# Patient Record
Sex: Female | Born: 1990 | Race: Black or African American | Hispanic: No | Marital: Single | Smoking: Never smoker
Health system: Southern US, Community
[De-identification: ages and names within clinical notes are randomized; demographics above are authoritative.]

## PROBLEM LIST (undated history)

## (undated) DIAGNOSIS — E079 Disorder of thyroid, unspecified: Secondary | ICD-10-CM

## (undated) DIAGNOSIS — D649 Anemia, unspecified: Secondary | ICD-10-CM

## (undated) DIAGNOSIS — A599 Trichomoniasis, unspecified: Secondary | ICD-10-CM

## (undated) HISTORY — DX: Disorder of thyroid, unspecified: E07.9

## (undated) HISTORY — DX: Anemia, unspecified: D64.9

## (undated) HISTORY — DX: Trichomoniasis, unspecified: A59.9

---
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
## 2011-02-27 LAB — URINALYSIS, ROUTINE W REFLEX MICROSCOPIC
Bilirubin Urine: NEGATIVE
Glucose, UA: NEGATIVE mg/dL
Ketones, ur: NEGATIVE mg/dL
Protein, ur: NEGATIVE mg/dL
Specific Gravity, Urine: <1.005 — ABNORMAL LOW (ref 1.005–1.030)

## 2011-02-27 LAB — URINE MICROSCOPIC-ADD ON

## 2011-03-22 LAB — URINALYSIS, ROUTINE W REFLEX MICROSCOPIC
Hgb urine dipstick: NEGATIVE
Nitrite: NEGATIVE
Protein, ur: NEGATIVE mg/dL
Urobilinogen, UA: 0.2 mg/dL (ref 0.0–1.0)
pH: 5.5 (ref 5.0–8.0)

## 2011-03-22 LAB — COMPREHENSIVE METABOLIC PANEL
ALT: 14 U/L (ref 0–35)
Alkaline Phosphatase: 88 U/L (ref 39–117)
Calcium: 9.7 mg/dL (ref 8.4–10.5)
Chloride: 106 mEq/L (ref 96–112)
GFR calc Af Amer: >60 mL/min (ref >60)
GFR calc non Af Amer: >60 mL/min (ref >60)
Glucose, Bld: 88 mg/dL (ref 70–99)
Potassium: 4 mEq/L (ref 3.5–5.1)
Total Bilirubin: 0.5 mg/dL (ref 0.3–1.2)

## 2011-03-22 LAB — URINE MICROSCOPIC-ADD ON

## 2011-03-22 LAB — CBC: RDW: 15.1 % (ref 11.5–15.5)

## 2011-03-22 LAB — DIFFERENTIAL
Eosinophils Absolute: 0 10*3/uL (ref 0.0–0.7)
Monocytes Absolute: 0.8 10*3/uL (ref 0.1–1.0)
Neutro Abs: 5.3 10*3/uL (ref 1.7–7.7)
Neutrophils Relative %: 68 % (ref 43–77)

## 2011-03-22 LAB — POCT PREGNANCY, URINE: Preg Test, Ur: NEGATIVE

## 2011-09-16 LAB — POCT PREGNANCY, URINE: Preg Test, Ur: NEGATIVE

## 2011-10-06 ENCOUNTER — Emergency Department (HOSPITAL_COMMUNITY)
Admission: EM | Admit: 2011-10-06 | Discharge: 2011-10-06 | Disposition: A | Discharge status: Home / Self Care | Payer: Self-pay | Attending: Emergency Medicine | Admitting: Emergency Medicine

## 2011-10-06 DIAGNOSIS — L299 Pruritus, unspecified: Secondary | ICD-10-CM | POA: Insufficient documentation

## 2011-10-06 DIAGNOSIS — J45909 Unspecified asthma, uncomplicated: Secondary | ICD-10-CM | POA: Insufficient documentation

## 2011-10-06 DIAGNOSIS — B86 Scabies: Secondary | ICD-10-CM | POA: Insufficient documentation

## 2011-11-05 ENCOUNTER — Encounter: Payer: Self-pay | Admitting: Nurse Practitioner

## 2011-11-05 ENCOUNTER — Emergency Department (HOSPITAL_COMMUNITY)
Admission: EM | Admit: 2011-11-05 | Discharge: 2011-11-05 | Disposition: A | Discharge status: Home / Self Care | Payer: Medicaid Other | Attending: Emergency Medicine | Admitting: Emergency Medicine

## 2011-11-05 DIAGNOSIS — N852 Hypertrophy of uterus: Secondary | ICD-10-CM | POA: Insufficient documentation

## 2011-11-05 DIAGNOSIS — J45909 Unspecified asthma, uncomplicated: Secondary | ICD-10-CM | POA: Insufficient documentation

## 2011-11-05 DIAGNOSIS — O2390 Unspecified genitourinary tract infection in pregnancy, unspecified trimester: Secondary | ICD-10-CM | POA: Insufficient documentation

## 2011-11-05 DIAGNOSIS — R319 Hematuria, unspecified: Secondary | ICD-10-CM | POA: Insufficient documentation

## 2011-11-05 DIAGNOSIS — N390 Urinary tract infection, site not specified: Secondary | ICD-10-CM | POA: Insufficient documentation

## 2011-11-05 DIAGNOSIS — O2690 Pregnancy related conditions, unspecified, unspecified trimester: Secondary | ICD-10-CM

## 2011-11-05 LAB — URINALYSIS, ROUTINE W REFLEX MICROSCOPIC
Bilirubin Urine: NEGATIVE
Glucose, UA: NEGATIVE mg/dL
Protein, ur: 100 mg/dL — AB
pH: 7 (ref 5.0–8.0)

## 2011-11-05 LAB — URINE MICROSCOPIC-ADD ON

## 2011-11-05 LAB — PREGNANCY, URINE: Preg Test, Ur: POSITIVE

## 2011-11-05 MED ORDER — NITROFURANTOIN MONOHYD MACRO 100 MG PO CAPS: 100.0000 mg | ORAL_CAPSULE | Freq: Two times a day (BID) | ORAL | Status: AC

## 2011-11-05 MED ORDER — NITROFURANTOIN MONOHYD MACRO 100 MG PO CAPS
100.0000 mg | ORAL_CAPSULE | ORAL | Status: AC
  Administered 2011-11-05: 100 mg via ORAL
  Filled 2011-11-05: qty 1

## 2011-11-05 NOTE — ED Notes (Signed)
States 11 weeks, 6 days pregnant confirmed at [facility] last month , no OBGYN care yet. Reports today she feels pressure when voiding and noticed a small spot of blood on tissue when she wiped

## 2011-11-05 NOTE — ED Provider Notes (Signed)
History     CSN: [id number] Arrival date & time: 11/05/2011  6:18 PM   First MD Initiated Contact with Patient 11/05/11 1838      Chief Complaint  Patient presents with  . urinary pressure     (Consider location/radiation/quality/duration/timing/severity/associated sxs/prior treatment) HPI Patient states she is 11 weeks 6 days pregnant. She was diagnosed as pregnant last month at [facility] urgent care. Patient has not had an OB/GYN appointment yet. Patient is G1 P0 and today she felt a pressure sensation when voiding. She also noticed a small amount of blood in the urine when she wiped. Patient denies any vaginal bleeding. She denies any abdominal pain. She denies any cramping. Nothing seems to make it better or worse symptoms are mild. Patient states is her first pregnancy she was worse and she came to the hospital to get checked. Past Medical History  Diagnosis Date  . Asthma     History reviewed. No pertinent past surgical history.  History reviewed. No pertinent family history.  History  Substance Use Topics  . Smoking status: Never Smoker   . Smokeless tobacco: Not on file  . Alcohol Use: No    OB History    Grav Para Term Preterm Abortions TAB SAB Ect Mult Living   1               Review of Systems  All other systems reviewed and are negative.    Allergies  Review of patient's allergies indicates no known allergies.  Home Medications   Current Outpatient Rx  Name Route Sig Dispense Refill  . ALBUTEROL SULFATE HFA 108 (90 BASE) MCG/ACT IN AERS Inhalation Inhale 2 puffs into the lungs every 6 (six) hours as needed. For shortness of breath     . PRENATE ELITE 90-600-400 MG-MCG-MCG PO TABS Oral Take 1 tablet by mouth daily.        BP 114/64  Pulse 93  Temp(Src) 98.2 F (36.8 C) (Oral)  Resp 15  Ht 5\' 4"  (1.626 m)  Wt 115 lb (52.164 kg)  BMI 19.74 kg/m2  SpO2 100%  Physical Exam  Nursing note and vitals reviewed. Constitutional: She appears  well-developed and well-nourished. No distress.  HENT:  Head: Normocephalic and atraumatic.  Right Ear: External ear normal.  Left Ear: External ear normal.  Eyes: Conjunctivae are normal. Right eye exhibits no discharge. Left eye exhibits no discharge. No scleral icterus.  Neck: Neck supple. No tracheal deviation present.  Cardiovascular: Normal rate, regular rhythm and intact distal pulses.   Pulmonary/Chest: Effort normal and breath sounds normal. No stridor. No respiratory distress. She has no wheezes. She has no rales.  Abdominal: Soft. Bowel sounds are normal. She exhibits no distension. There is no tenderness. There is no rebound and no guarding.  Genitourinary: Vagina normal. There is no rash, tenderness or lesion on the right labia. There is no rash, tenderness, lesion or injury on the left labia. Uterus is enlarged. Uterus is not tender. Cervix exhibits no motion tenderness and no friability. Right adnexum displays no mass, no tenderness and no fullness. Left adnexum displays no mass, no tenderness and no fullness. No bleeding around the vagina. No vaginal discharge found.  Musculoskeletal: She exhibits no edema and no tenderness.  Neurological: She is alert. She has normal strength. No sensory deficit. Cranial nerve deficit:  no gross defecits noted. She exhibits normal muscle tone. She displays no seizure activity. Coordination normal.  Skin: Skin is warm and dry. No rash noted.  Psychiatric:  She has a normal mood and affect.    ED Course  Procedures (including critical care time)  Labs Reviewed  URINALYSIS, ROUTINE W REFLEX MICROSCOPIC - Abnormal; Notable for the following:    Appearance TURBID (*)    Hgb urine dipstick LARGE (*)    Protein, ur 100 (*)    Leukocytes, UA MODERATE (*)    All other components within normal limits  PREGNANCY, URINE  URINE MICROSCOPIC-ADD ON  GC/CHLAMYDIA PROBE AMP, GENITAL   No results found.   Diagnosis: Urinary tract infection,  pregnancy   MDM  Patient has not had any abdominal pain.  On exam there is no evidence of vaginal bleeding. I suspect her urinary pressure symptoms are associated with urinary tract infection. At this point there does not appear to be any acute complications associated with her pregnancy.        Celene Kras, MD 11/05/11 2031

## 2011-12-27 ENCOUNTER — Encounter (HOSPITAL_COMMUNITY): Payer: Self-pay | Admitting: *Deleted

## 2011-12-27 ENCOUNTER — Emergency Department (HOSPITAL_COMMUNITY)
Admission: EM | Admit: 2011-12-27 | Discharge: 2011-12-27 | Disposition: A | Discharge status: Home / Self Care | Payer: Medicaid Other | Attending: Emergency Medicine | Admitting: Emergency Medicine

## 2011-12-27 DIAGNOSIS — J45909 Unspecified asthma, uncomplicated: Secondary | ICD-10-CM | POA: Insufficient documentation

## 2011-12-27 DIAGNOSIS — R51 Headache: Secondary | ICD-10-CM | POA: Insufficient documentation

## 2011-12-27 DIAGNOSIS — R05 Cough: Secondary | ICD-10-CM | POA: Insufficient documentation

## 2011-12-27 DIAGNOSIS — J029 Acute pharyngitis, unspecified: Secondary | ICD-10-CM | POA: Insufficient documentation

## 2011-12-27 DIAGNOSIS — R059 Cough, unspecified: Secondary | ICD-10-CM | POA: Insufficient documentation

## 2011-12-27 DIAGNOSIS — O9989 Other specified diseases and conditions complicating pregnancy, childbirth and the puerperium: Secondary | ICD-10-CM | POA: Insufficient documentation

## 2011-12-27 DIAGNOSIS — J069 Acute upper respiratory infection, unspecified: Secondary | ICD-10-CM | POA: Insufficient documentation

## 2011-12-27 LAB — RAPID STREP SCREEN (MED CTR MEBANE ONLY): Streptococcus, Group A Screen (Direct): NEGATIVE

## 2011-12-27 NOTE — ED Provider Notes (Signed)
History     CSN: [id number]  Arrival date & time 12/27/11  0745   First MD Initiated Contact with Patient 12/27/11 [phone]      Chief Complaint  Patient presents with  . Cough  . Sore Throat    (Consider location/radiation/quality/duration/timing/severity/associated sxs/prior treatment) Patient is a 21 y.o. female presenting with cough and pharyngitis. The history is provided by the patient.  Cough The current episode started yesterday. The problem occurs constantly. The problem has been gradually worsening. The cough is non-productive. There has been no fever. Associated symptoms include headaches and sore throat. Pertinent negatives include no myalgias and no shortness of breath. She has tried nothing for the symptoms.  Sore Throat Associated symptoms include headaches. Pertinent negatives include no shortness of breath.   patient is worried that she has sore throat. It increases when she coughs to swallow or drink anything. She is pregnant so she did not want to take anything over-the-counter. She denies any vaginal bleeding or discharge. She denies any abdominal pain or any other complaints associated with her pregnancy.  She is [redacted] weeks pregnant.    Past Medical History  Diagnosis Date  . Asthma     History reviewed. No pertinent past surgical history.  No family history on file.  History  Substance Use Topics  . Smoking status: Never Smoker   . Smokeless tobacco: Not on file  . Alcohol Use: No    OB History    Grav Para Term Preterm Abortions TAB SAB Ect Mult Living   1               Review of Systems  HENT: Positive for sore throat.   Respiratory: Positive for cough. Negative for shortness of breath.   Musculoskeletal: Negative for myalgias.  Neurological: Positive for headaches.  All other systems reviewed and are negative.    Allergies  Review of patient's allergies indicates no known allergies.  Home Medications   Current Outpatient Rx  Name Route  Sig Dispense Refill  . PRENATE ELITE 90-600-400 MG-MCG-MCG PO TABS Oral Take 1 tablet by mouth daily.      . ALBUTEROL SULFATE HFA 108 (90 BASE) MCG/ACT IN AERS Inhalation Inhale 2 puffs into the lungs every 6 (six) hours as needed. For shortness of breath       BP 102/53  Pulse 86  Temp(Src) 98.1 F (36.7 C) (Oral)  Resp 16  SpO2 100%  Physical Exam  Nursing note and vitals reviewed. Constitutional: She appears well-developed and well-nourished. No distress.  HENT:  Head: Normocephalic and atraumatic. No trismus in the jaw.  Right Ear: Tympanic membrane and external ear normal.  Left Ear: Tympanic membrane and external ear normal.  Mouth/Throat: Oropharynx is clear and moist. No oral lesions. Normal dentition. No uvula swelling. No oropharyngeal exudate, posterior oropharyngeal edema, posterior oropharyngeal erythema or tonsillar abscesses.  Eyes: Conjunctivae are normal. Right eye exhibits no discharge. Left eye exhibits no discharge. No scleral icterus.  Neck: Neck supple. No tracheal deviation present.  Cardiovascular: Normal rate and regular rhythm.   Pulmonary/Chest: Effort normal and breath sounds normal. No stridor. No respiratory distress.  Abdominal: Soft. There is no tenderness.  Musculoskeletal: She exhibits no edema.  Neurological: She is alert. Cranial nerve deficit: no gross deficits.  Skin: Skin is warm and dry. No rash noted.  Psychiatric: She has a normal mood and affect.    ED Course  Procedures (including critical care time)   Labs Reviewed  RAPID STREP SCREEN  No results found.   1. Pharyngitis   2. URI (upper respiratory infection)       MDM  Likely viral pharyngitis associated with her cold symptoms.  Discussed supportive care, tylenol as needed for pain.       Celene Kras, MD 12/27/11 [phone]

## 2011-12-27 NOTE — ED Notes (Signed)
Pt sts she had a sore throat last night that got worse this am, developed dry cough, chest and nasal congestion, HA. Denies fever at home. Sts 4 months pregnant and did not want to take any meds because she wasn't sure she could.

## 2012-01-05 ENCOUNTER — Other Ambulatory Visit (HOSPITAL_COMMUNITY): Payer: Self-pay | Admitting: Obstetrics

## 2012-01-05 DIAGNOSIS — Z3689 Encounter for other specified antenatal screening: Secondary | ICD-10-CM

## 2012-01-10 ENCOUNTER — Ambulatory Visit (HOSPITAL_COMMUNITY)
Admission: RE | Admit: 2012-01-10 | Discharge: 2012-01-10 | Disposition: A | Discharge status: Home / Self Care | Payer: Medicaid Other | Source: Ambulatory Visit | Attending: Obstetrics | Admitting: Obstetrics

## 2012-01-10 DIAGNOSIS — Z3689 Encounter for other specified antenatal screening: Secondary | ICD-10-CM

## 2012-01-10 DIAGNOSIS — Z1389 Encounter for screening for other disorder: Secondary | ICD-10-CM | POA: Insufficient documentation

## 2012-01-10 DIAGNOSIS — Z363 Encounter for antenatal screening for malformations: Secondary | ICD-10-CM | POA: Insufficient documentation

## 2012-01-10 DIAGNOSIS — O358XX0 Maternal care for other (suspected) fetal abnormality and damage, not applicable or unspecified: Secondary | ICD-10-CM | POA: Insufficient documentation

## 2013-09-24 ENCOUNTER — Encounter (HOSPITAL_COMMUNITY): Payer: Self-pay | Admitting: Emergency Medicine

## 2013-09-24 ENCOUNTER — Emergency Department (HOSPITAL_COMMUNITY): Payer: Medicaid Other

## 2013-09-24 ENCOUNTER — Emergency Department (HOSPITAL_COMMUNITY)
Admission: EM | Admit: 2013-09-24 | Discharge: 2013-09-24 | Disposition: A | Discharge status: Home / Self Care | Payer: Medicaid Other | Attending: Emergency Medicine | Admitting: Emergency Medicine

## 2013-09-24 DIAGNOSIS — M67919 Unspecified disorder of synovium and tendon, unspecified shoulder: Secondary | ICD-10-CM | POA: Insufficient documentation

## 2013-09-24 DIAGNOSIS — M76899 Other specified enthesopathies of unspecified lower limb, excluding foot: Secondary | ICD-10-CM

## 2013-09-24 DIAGNOSIS — Z79899 Other long term (current) drug therapy: Secondary | ICD-10-CM | POA: Insufficient documentation

## 2013-09-24 DIAGNOSIS — M719 Bursopathy, unspecified: Secondary | ICD-10-CM | POA: Insufficient documentation

## 2013-09-24 DIAGNOSIS — J45909 Unspecified asthma, uncomplicated: Secondary | ICD-10-CM | POA: Insufficient documentation

## 2013-09-24 MED ORDER — HYDROCODONE-ACETAMINOPHEN 5-325 MG PO TABS: 1.0000 | ORAL_TABLET | Freq: Four times a day (QID) | ORAL | Status: DC | PRN

## 2013-09-24 MED ORDER — IBUPROFEN 800 MG PO TABS: 800.0000 mg | ORAL_TABLET | Freq: Three times a day (TID) | ORAL | Status: DC | PRN

## 2013-09-24 NOTE — ED Provider Notes (Signed)
CSN: [id number]     Arrival date & time 09/24/13  1230 History  This chart was scribed for non-physician practitioner Ebbie Ridge, PA-C, working with Laray Anger, DO by Ronal Fear, ED scribe. This patient was seen in room TR09C/TR09C and the patient's care was started at 1:55 PM.    Chief Complaint  Patient presents with  . Shoulder Pain    The history is provided by the patient. No language interpreter was used.   HPI Comments: Kathleen Holland is a 22 y.o. female who presents to the Emergency Department complaining of gradual onset left shoulder pain onset 4x days ago. She denies fall, or injury to the area. Pt denies fever or cough.   Past Medical History  Diagnosis Date  . Asthma    History reviewed. No pertinent past surgical history. No family history on file. History  Substance Use Topics  . Smoking status: Never Smoker   . Smokeless tobacco: Not on file  . Alcohol Use: No   OB History   Grav Para Term Preterm Abortions TAB SAB Ect Mult Living   1              Review of Systems  Constitutional: Negative for fever.  Respiratory: Negative for cough.   Musculoskeletal: Positive for myalgias.  All other systems reviewed and are negative.    Allergies  Review of patient's allergies indicates no known allergies.  Home Medications   Current Outpatient Rx  Name  Route  Sig  Dispense  Refill  . albuterol (PROVENTIL HFA;VENTOLIN HFA) 108 (90 BASE) MCG/ACT inhaler   Inhalation   Inhale 2 puffs into the lungs every 6 (six) hours as needed. For shortness of breath           BP 129/56  Pulse 71  Temp(Src) 98.5 F (36.9 C) (Oral)  Resp 16  Wt 128 lb 12.8 oz (58.423 kg)  BMI 22.1 kg/m2  SpO2 100%  LMP 09/14/2013  Breastfeeding? Unknown Physical Exam  Nursing note and vitals reviewed. Constitutional: She is oriented to person, place, and time. She appears well-developed and well-nourished. No distress.  HENT:  Head: Normocephalic and atraumatic.   Eyes: EOM are normal.  Neck: Neck supple. No tracheal deviation present.  Cardiovascular: Normal rate.   Pulmonary/Chest: Effort normal. No respiratory distress.  Musculoskeletal: Normal range of motion. She exhibits tenderness.  Neurological: She is alert and oriented to person, place, and time.  Skin: Skin is warm and dry.  Psychiatric: She has a normal mood and affect. Her behavior is normal.    ED Course  Procedures (including critical care time) DIAGNOSTIC STUDIES: Oxygen Saturation is 100% on RA, normal by my interpretation.    COORDINATION OF CARE:  1:57 PM- Pt advised of plan for treatment including an X-ray and pt agrees.  Return here as needed.  Advised the patient is most likely tendinitis and shoulder strain.  She is also given orthopedic followup.  Told to use ice and heat on her shoulder    I personally performed the services described in this documentation, which was scribed in my presence. The recorded information has been reviewed and is accurate.   Carlyle Dolly, PA-C 09/24/13 1457

## 2013-09-24 NOTE — ED Notes (Signed)
Reports left shoulder pain x 4 days. Denies injury, pain with movement. Sensation intact. Pt is a x 4.

## 2013-09-25 NOTE — ED Provider Notes (Signed)
Medical screening examination/treatment/procedure(s) were performed by non-physician practitioner and as supervising physician I was immediately available for consultation/collaboration.   Laray Anger, DO 09/25/13 2128

## 2014-10-13 ENCOUNTER — Encounter (HOSPITAL_COMMUNITY): Payer: Self-pay | Admitting: Emergency Medicine

## 2014-12-10 ENCOUNTER — Encounter (HOSPITAL_COMMUNITY): Payer: Self-pay | Admitting: Emergency Medicine

## 2014-12-10 ENCOUNTER — Emergency Department (HOSPITAL_COMMUNITY): Payer: No Typology Code available for payment source

## 2014-12-10 ENCOUNTER — Emergency Department (HOSPITAL_COMMUNITY)
Admission: EM | Admit: 2014-12-10 | Discharge: 2014-12-10 | Disposition: A | Discharge status: Home / Self Care | Payer: No Typology Code available for payment source | Attending: Emergency Medicine | Admitting: Emergency Medicine

## 2014-12-10 DIAGNOSIS — Y9389 Activity, other specified: Secondary | ICD-10-CM | POA: Insufficient documentation

## 2014-12-10 DIAGNOSIS — S3992XA Unspecified injury of lower back, initial encounter: Secondary | ICD-10-CM | POA: Insufficient documentation

## 2014-12-10 DIAGNOSIS — Z79899 Other long term (current) drug therapy: Secondary | ICD-10-CM | POA: Diagnosis not present

## 2014-12-10 DIAGNOSIS — S199XXA Unspecified injury of neck, initial encounter: Secondary | ICD-10-CM | POA: Insufficient documentation

## 2014-12-10 DIAGNOSIS — S39012A Strain of muscle, fascia and tendon of lower back, initial encounter: Secondary | ICD-10-CM | POA: Diagnosis not present

## 2014-12-10 DIAGNOSIS — Y92410 Unspecified street and highway as the place of occurrence of the external cause: Secondary | ICD-10-CM | POA: Diagnosis not present

## 2014-12-10 DIAGNOSIS — M545 Low back pain, unspecified: Secondary | ICD-10-CM

## 2014-12-10 DIAGNOSIS — J45909 Unspecified asthma, uncomplicated: Secondary | ICD-10-CM | POA: Insufficient documentation

## 2014-12-10 DIAGNOSIS — Y998 Other external cause status: Secondary | ICD-10-CM | POA: Diagnosis not present

## 2014-12-10 MED ORDER — NAPROXEN 500 MG PO TABS: 500.0000 mg | ORAL_TABLET | Freq: Two times a day (BID) | ORAL | Status: DC

## 2014-12-10 MED ORDER — METHOCARBAMOL 500 MG PO TABS: 500.0000 mg | ORAL_TABLET | Freq: Two times a day (BID) | ORAL | Status: DC

## 2014-12-10 MED ORDER — IBUPROFEN 400 MG PO TABS
800.0000 mg | ORAL_TABLET | Freq: Once | ORAL | Status: AC
  Administered 2014-12-10: 800 mg via ORAL
  Filled 2014-12-10: qty 2
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
## 2014-12-10 NOTE — Discharge Instructions (Signed)
1. Medications: robaxin, naproxyn, usual home medications °2. Treatment: rest, drink plenty of fluids, gentle stretching as discussed, alternate ice and heat °3. Follow Up: Please followup with your primary doctor in 3 days for discussion of your diagnoses and further evaluation after today's visit; if you do not have a primary care doctor use the resource guide provided to find one;  Return to the ER for worsening back pain, difficulty walking, loss of bowel or bladder control or other concerning symptoms ° ° ° °Back Exercises °Back exercises help treat and prevent back injuries. The goal of back exercises is to increase the strength of your abdominal and back muscles and the flexibility of your back. These exercises should be started when you no longer have back pain. Back exercises include: °· Pelvic Tilt. Lie on your back with your knees bent. Tilt your pelvis until the lower part of your back is against the floor. Hold this position 5 to 10 sec and repeat 5 to 10 times. °· Knee to Chest. Pull first 1 knee up against your chest and hold for 20 to 30 seconds, repeat this with the other knee, and then both knees. This may be done with the other leg straight or bent, whichever feels better. °· Sit-Ups or Curl-Ups. Bend your knees 90 degrees. Start with tilting your pelvis, and do a partial, slow sit-up, lifting your trunk only 30 to 45 degrees off the floor. Take at least 2 to 3 seconds for each sit-up. Do not do sit-ups with your knees out straight. If partial sit-ups are difficult, simply do the above but with only tightening your abdominal muscles and holding it as directed. °· Hip-Lift. Lie on your back with your knees flexed 90 degrees. Push down with your feet and shoulders as you raise your hips a couple inches off the floor; hold for 10 seconds, repeat 5 to 10 times. °· Back arches. Lie on your stomach, propping yourself up on bent elbows. Slowly press on your hands, causing an arch in your low back. Repeat  3 to 5 times. Any initial stiffness and discomfort should lessen with repetition over time. °· Shoulder-Lifts. Lie face down with arms beside your body. Keep hips and torso pressed to floor as you slowly lift your head and shoulders off the floor. °Do not overdo your exercises, especially in the beginning. Exercises may cause you some mild back discomfort which lasts for a few minutes; however, if the pain is more severe, or lasts for more than 15 minutes, do not continue exercises until you see your caregiver. Improvement with exercise therapy for back problems is slow.  °See your caregivers for assistance with developing a proper back exercise program. °Document Released: 01/05/2005 Document Revised: 02/20/2012 Document Reviewed: 09/29/2011 °[employer]® Patient Information ©2015 [employer]. This information is not intended to replace advice given to you by your health care provider. Make sure you discuss any questions you have with your health care provider. ° °
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
## 2015-05-20 LAB — CYTOLOGY - PAP
CHLAMYDIA TRACHOMATIS RNA: NEGATIVE
N GONORRHOEA RRNA NPH QL PCR: NEGATIVE
PAP SMEAR: NEGATIVE

## 2016-03-11 ENCOUNTER — Emergency Department (HOSPITAL_COMMUNITY)
Admission: EM | Admit: 2016-03-11 | Discharge: 2016-03-11 | Disposition: A | Discharge status: Home / Self Care | Payer: No Typology Code available for payment source | Attending: Emergency Medicine | Admitting: Emergency Medicine

## 2016-03-11 ENCOUNTER — Encounter (HOSPITAL_COMMUNITY): Payer: Self-pay | Admitting: Family Medicine

## 2016-03-11 DIAGNOSIS — Z791 Long term (current) use of non-steroidal anti-inflammatories (NSAID): Secondary | ICD-10-CM | POA: Diagnosis not present

## 2016-03-11 DIAGNOSIS — Z3202 Encounter for pregnancy test, result negative: Secondary | ICD-10-CM | POA: Diagnosis not present

## 2016-03-11 DIAGNOSIS — Z79899 Other long term (current) drug therapy: Secondary | ICD-10-CM | POA: Insufficient documentation

## 2016-03-11 DIAGNOSIS — J45909 Unspecified asthma, uncomplicated: Secondary | ICD-10-CM | POA: Insufficient documentation

## 2016-03-11 DIAGNOSIS — Y9389 Activity, other specified: Secondary | ICD-10-CM | POA: Insufficient documentation

## 2016-03-11 DIAGNOSIS — S3991XA Unspecified injury of abdomen, initial encounter: Secondary | ICD-10-CM | POA: Insufficient documentation

## 2016-03-11 DIAGNOSIS — Y92410 Unspecified street and highway as the place of occurrence of the external cause: Secondary | ICD-10-CM | POA: Insufficient documentation

## 2016-03-11 DIAGNOSIS — Y998 Other external cause status: Secondary | ICD-10-CM | POA: Diagnosis not present

## 2016-03-11 LAB — URINALYSIS, ROUTINE W REFLEX MICROSCOPIC
Bilirubin Urine: NEGATIVE
GLUCOSE, UA: NEGATIVE mg/dL
HGB URINE DIPSTICK: NEGATIVE
KETONES UR: NEGATIVE mg/dL
Nitrite: NEGATIVE
PROTEIN: NEGATIVE mg/dL
Specific Gravity, Urine: 1.034 — ABNORMAL HIGH (ref 1.005–1.030)
pH: 5.5 (ref 5.0–8.0)

## 2016-03-11 LAB — URINE MICROSCOPIC-ADD ON

## 2016-03-11 LAB — POC URINE PREG, ED: PREG TEST UR: NEGATIVE

## 2016-03-11 MED ORDER — IBUPROFEN 800 MG PO TABS: 800.0000 mg | ORAL_TABLET | Freq: Three times a day (TID) | ORAL | Status: DC | PRN

## 2016-03-11 MED ORDER — METHOCARBAMOL 500 MG PO TABS: 500.0000 mg | ORAL_TABLET | Freq: Two times a day (BID) | ORAL | Status: DC

## 2016-03-11 NOTE — ED Notes (Signed)
Unable to assess patient before discharge. See PA note for secondary assesment
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
## 2016-03-11 NOTE — Discharge Instructions (Signed)

## 2016-08-20 ENCOUNTER — Encounter: Payer: Self-pay | Admitting: *Deleted

## 2017-06-28 ENCOUNTER — Ambulatory Visit: Payer: Medicaid Other | Attending: Family Medicine | Admitting: Family Medicine

## 2017-06-28 ENCOUNTER — Other Ambulatory Visit (HOSPITAL_COMMUNITY)
Admission: RE | Admit: 2017-06-28 | Discharge: 2017-06-28 | Disposition: A | Discharge status: Home / Self Care | Payer: Medicaid Other | Source: Ambulatory Visit | Attending: Family Medicine | Admitting: Family Medicine

## 2017-06-28 ENCOUNTER — Encounter: Payer: Self-pay | Admitting: Family Medicine

## 2017-06-28 VITALS — BP 103/67 | HR 77 | Temp 98.3°F | Resp 18 | Ht 64.0 in | Wt 137.0 lb

## 2017-06-28 DIAGNOSIS — Z79899 Other long term (current) drug therapy: Secondary | ICD-10-CM | POA: Diagnosis not present

## 2017-06-28 DIAGNOSIS — Z0001 Encounter for general adult medical examination with abnormal findings: Secondary | ICD-10-CM | POA: Insufficient documentation

## 2017-06-28 DIAGNOSIS — A5901 Trichomonal vulvovaginitis: Secondary | ICD-10-CM | POA: Diagnosis not present

## 2017-06-28 DIAGNOSIS — Z8249 Family history of ischemic heart disease and other diseases of the circulatory system: Secondary | ICD-10-CM | POA: Diagnosis not present

## 2017-06-28 DIAGNOSIS — Z833 Family history of diabetes mellitus: Secondary | ICD-10-CM | POA: Diagnosis not present

## 2017-06-28 DIAGNOSIS — Z118 Encounter for screening for other infectious and parasitic diseases: Secondary | ICD-10-CM | POA: Insufficient documentation

## 2017-06-28 DIAGNOSIS — B9689 Other specified bacterial agents as the cause of diseases classified elsewhere: Secondary | ICD-10-CM | POA: Diagnosis not present

## 2017-06-28 DIAGNOSIS — Z809 Family history of malignant neoplasm, unspecified: Secondary | ICD-10-CM | POA: Diagnosis not present

## 2017-06-28 DIAGNOSIS — J45909 Unspecified asthma, uncomplicated: Secondary | ICD-10-CM | POA: Diagnosis not present

## 2017-06-28 DIAGNOSIS — Z23 Encounter for immunization: Secondary | ICD-10-CM | POA: Diagnosis not present

## 2017-06-28 DIAGNOSIS — N979 Female infertility, unspecified: Secondary | ICD-10-CM | POA: Diagnosis not present

## 2017-06-28 DIAGNOSIS — Z0000 Encounter for general adult medical examination without abnormal findings: Secondary | ICD-10-CM | POA: Insufficient documentation

## 2017-06-28 MED ORDER — ALBUTEROL SULFATE HFA 108 (90 BASE) MCG/ACT IN AERS: 2.0000 | INHALATION_SPRAY | Freq: Four times a day (QID) | RESPIRATORY_TRACT | 3 refills | Status: DC | PRN

## 2017-06-28 NOTE — Patient Instructions (Addendum)
Apply for orange card  Asthma, Adult Asthma is a condition of the lungs in which the airways tighten and narrow. Asthma can make it hard to breathe. Asthma cannot be cured, but medicine and lifestyle changes can help control it. Asthma may be started (triggered) by:  Animal skin flakes (dander).  Dust.  Cockroaches.  Pollen.  Mold.  Smoke.  Cleaning products.  Hair sprays or aerosol sprays.  Paint fumes or strong smells.  Cold air, weather changes, and winds.  Crying or laughing hard.  Stress.  Certain medicines or drugs.  Foods, such as dried fruit, potato chips, and sparkling grape juice.  Infections or conditions (colds, flu).  Exercise.  Certain medical conditions or diseases.  Exercise or tiring activities.  Follow these instructions at home:  Take medicine as told by your doctor.  Use a peak flow meter as told by your doctor. A peak flow meter is a tool that measures how well the lungs are working.  Record and keep track of the peak flow meter's readings.  Understand and use the asthma action plan. An asthma action plan is a written plan for taking care of your asthma and treating your attacks.  To help prevent asthma attacks: ? Do not smoke. Stay away from secondhand smoke. ? Change your heating and air conditioning filter often. ? Limit your use of fireplaces and wood stoves. ? Get rid of pests (such as roaches and mice) and their droppings. ? Throw away plants if you see mold on them. ? Clean your floors. Dust regularly. Use cleaning products that do not smell. ? Have someone vacuum when you are not home. Use a vacuum cleaner with a HEPA filter if possible. ? Replace carpet with wood, tile, or vinyl flooring. Carpet can trap animal skin flakes and dust. ? Use allergy-proof pillows, mattress covers, and box spring covers. ? Wash bed sheets and blankets every week in hot water and dry them in a dryer. ? Use blankets that are made of polyester or  cotton. ? Clean bathrooms and kitchens with bleach. If possible, have someone repaint the walls in these rooms with mold-resistant paint. Keep out of the rooms that are being cleaned and painted. ? Wash hands often. Contact a doctor if:  You have make a whistling sound when breaking (wheeze), have shortness of breath, or have a cough even if taking medicine to prevent attacks.  The colored mucus you cough up (sputum) is thicker than usual.  The colored mucus you cough up changes from clear or white to yellow, green, gray, or bloody.  You have problems from the medicine you are taking such as: ? A rash. ? Itching. ? Swelling. ? Trouble breathing.  You need reliever medicines more than 2-3 times a week.  Your peak flow measurement is still at 50-79% of your personal best after following the action plan for 1 hour.  You have a fever. Get help right away if:  You seem to be worse and are not responding to medicine during an asthma attack.  You are short of breath even at rest.  You get short of breath when doing very little activity.  You have trouble eating, drinking, or talking.  You have chest pain.  You have a fast heartbeat.  Your lips or fingernails start to turn blue.  You are light-headed, dizzy, or faint.  Your peak flow is less than 50% of your personal best. This information is not intended to replace advice given to you by  your health care provider. Make sure you discuss any questions you have with your health care provider. Document Released: 05/16/2008 Document Revised: 05/05/2016 Document Reviewed: 06/27/2013 Elsevier Interactive Patient Education  2017 [employer]   Eating Healthy on a Budget There are many ways to save money at the grocery store and continue to eat healthy. You can be successful if you plan your meals according to your budget, purchase according to your budget and grocery list, and prepare food yourself. How can I buy more food on a  limited budget? Plan  Plan meals and snacks according to a grocery list and budget you create.  Look for recipes where you can cook once and make enough food for two meals.  Include meals that will "stretch" more expensive foods such as stews, casseroles, and stir-fry dishes.  Make a grocery list and make sure to bring it with you to the store. If you have a smart phone, you could use your phone to create your shopping list. Purchase  When grocery shopping, buy only the items on your grocery list and go only to the areas of the store that have the items on your list. Prepare  Some meal items can be prepared in advance. Pre-cook on days when you have extra time.  Make extra food (such as by doubling recipes) and freeze the extras in meal-sized containers or in individual portions for fast meals and snacks.  Use leftovers in your meal plan for the week.  Try some meatless meals or try "no cook" meals like salads.  When you come home from the grocery store, wash and prepare your fruits and vegetables so they are ready to use and eat. This will help reduce food waste. How can I buy more food on a limited budget? Try these tips the next time you go shopping:  [place] store brands or generic brands.  Use coupons only for foods and brands you normally buy. Avoid buying items you wouldn't normally buy simply because they are on sale.  Check online and in newspapers for weekly deals.  Buy healthy items from the bulk bins when available, such as herbs, spices, flours, pastas, nuts, and dried fruit.  Buy fruits and vegetables that are in season. Prices are usually lower on in-season produce.  Compare and contrast different items. You can do this by looking at the unit price on the price tag. Use it to compare different brands and sizes to find out which item is the best deal.  Choose naturally low-cost healthy items, such as carrots, potatoes, apples, bananas, and oranges. Dried or canned  beans are a low-cost protein source.  Buy in bulk and freeze extra food. Items you can buy in bulk include meats, fish, poultry, frozen fruits, and frozen vegetables.  Limit the purchase of prepared or "ready-to-eat" foods, such as pre-cut fruits and vegetables and pre-made salads.  If possible, shop around to discover which grocery store offers the best prices. Some stores charge much more than other stores for the same items.  Do not shop when you are hungry. If you shop while hungry, It may be hard to stick to your list and budget.  Stick to your list and resist impulse buys. Treat your list as your official plan for the week.  Buy a variety of vegetables and fruit by purchasing fresh, frozen, and canned items.  Look beyond eye level. Foods at eye level (adult or child eye level) are more expensive. Look at the top and bottom  shelves for deals.  Be efficient with your time when shopping. The more time you spend at the store, the more money you are likely to spend.  Consider other retailers such as dollar stores, larger [employer], local fruit and vegetable stands, and farmers markets.  What are some tips for less expensive food substitutions? When choosing more expensive foods like meats and dairy, try these tips to save money:  Choose cheaper cuts of meat, such as bone-in chicken thighs and drumsticks instead skinless and boneless chicken. When you are ready to prepare the chicken, you can remove the skin yourself to make it healthier.  Choose lean meats like chicken or [place]. When choosing ground beef, make sure it is lean ground beef (92% lean, 8% fat). If you do buy a fattier ground beef, drain the fat before eating.  Buy dried beans and peas, such as lentils, split peas, or kidney beans.  For seafood, choose canned tuna, salmon, or sardines.  Eggs are a low-cost source of protein.  Buy the larger tubs of yogurt instead of individual-sized containers.  Choose water  instead of sodas and other sweetened beverages.  Skip buying chips, cookies, and other "junk food". These items are usually expensive, high in calories, and low in nutritional value.  How can I prepare the foods I buy in the healthiest way? Practice these tips for cooking foods in the healthiest way to reduce excess fat and calorie intake:  Steam, saute, grill, or bake foods instead of frying them.  Make sure half your plate is filled with fruits or vegetables. Choose from fresh, frozen, or canned fruits and vegetables. If eating canned, remember to rinse them before eating. This will remove any excess salt added for packaging.  Trim all fat from meat before cooking. Remove the skin from chicken or [place].  Spoon off fat from meat dishes once they have been chilled in the refrigerator and the fat has hardened on the top.  Use skim milk, low-fat milk, or evaporated skim milk when making cream sauces, soups, or puddings.  Substitute low-fat yogurt, sour cream, or cottage cheese for sour cream and mayonnaise in dips and dressings.  Try lemon juice, herbs, or spices to season food instead of salt, butter, or margarine.  This information is not intended to replace advice given to you by your health care provider. Make sure you discuss any questions you have with your health care provider. Document Released: 08/01/2014 Document Revised: 06/17/2016 Document Reviewed: 07/01/2014 Elsevier Interactive Patient Education  [employer].

## 2017-06-28 NOTE — Progress Notes (Signed)
Patient is here for establish care   Patient is not on any current med  Patient ha snot eaten for today

## 2017-06-28 NOTE — Progress Notes (Signed)
Subjective:   Patient ID: Kathleen Holland, female    DOB: [birth year], 26 y.o.   MRN: [id number]  Chief Complaint  Patient presents with  . Establish Care  . Annual Exam   HPI Kathleen Holland 26 y.o. female presents to establish care and for annual physical examination. She denies any CP, SOB, palpations, or syncope. History of asthma she reports using her albuterol inhaler several months ago. Denies any recent exacerbation or worsening of symptoms.. She is a non-smoker, denies alcohol use. Reports trying to conceive for last year with partner unsuccessfully. Reports her and her partner both have children from previous relationships. Denies irregular menstrual cycles.She is agreeable to STI testing with annual physical exam.  Denies symptoms of all other pertinent systems.    Past Medical History:  Diagnosis Date  . Asthma     History reviewed. No pertinent surgical history.  Family History  Problem Relation Age of Onset  . Diabetes Maternal Aunt   . Cancer Maternal Aunt   . Hypertension Maternal Grandmother   . Diabetes Maternal Grandmother   . Diabetes Maternal Grandfather     Social History   Social History  . Marital status: Single    Spouse name: N/A  . Number of children: N/A  . Years of education: N/A   Occupational History  . Not on file.   Social History Main Topics  . Smoking status: Never Smoker  . Smokeless tobacco: Never Used  . Alcohol use No  . Drug use: No  . Sexual activity: Not on file   Other Topics Concern  . Not on file   Social History Narrative  . No narrative on file    Outpatient Medications Prior to Visit  Medication Sig Dispense Refill  . HYDROcodone-acetaminophen (NORCO/VICODIN) 5-325 MG per tablet Take 1 tablet by mouth every 6 (six) hours as needed for pain. (Patient not taking: Reported on 06/28/2017) 15 tablet 0  . ibuprofen (ADVIL,MOTRIN) 800 MG tablet Take 1 tablet (800 mg total) by mouth every 8 (eight) hours as  needed. (Patient not taking: Reported on 06/28/2017) 21 tablet 0  . methocarbamol (ROBAXIN) 500 MG tablet Take 1 tablet (500 mg total) by mouth 2 (two) times daily. (Patient not taking: Reported on 06/28/2017) 20 tablet 0  . naproxen (NAPROSYN) 500 MG tablet Take 1 tablet (500 mg total) by mouth 2 (two) times daily with a meal. (Patient not taking: Reported on 06/28/2017) 30 tablet 0  . albuterol (PROVENTIL HFA;VENTOLIN HFA) 108 (90 BASE) MCG/ACT inhaler Inhale 2 puffs into the lungs every 6 (six) hours as needed. For shortness of breath      No facility-administered medications prior to visit.     No Known Allergies  Review of Systems  Constitutional: Negative.   HENT: Negative.   Eyes: Negative.   Respiratory: Negative.   Cardiovascular: Negative.   Gastrointestinal: Negative.   Genitourinary:       Difficulty conceiving  Musculoskeletal: Negative.   Skin: Negative.   Neurological: Negative.   Endo/Heme/Allergies: Negative.   Psychiatric/Behavioral: Negative.     Objective:    Physical Exam  Constitutional: She is oriented to person, place, and time. She appears well-developed and well-nourished.  HENT:  Head: Normocephalic and atraumatic.  Right Ear: External ear normal.  Left Ear: External ear normal.  Nose: Nose normal.  Mouth/Throat: Oropharynx is clear and moist.  Eyes: Pupils are equal, round, and reactive to light. Conjunctivae and EOM are normal.  Neck: Normal range of motion. Neck  supple. No JVD present. No thyromegaly present.  Cardiovascular: Normal rate, regular rhythm, normal heart sounds and intact distal pulses.   Pulmonary/Chest: Effort normal and breath sounds normal.  Abdominal: Soft. Bowel sounds are normal.  Musculoskeletal: Normal range of motion.  Lymphadenopathy:    She has no cervical adenopathy.  Neurological: She is alert and oriented to person, place, and time. She has normal reflexes.  Skin: Skin is warm and dry.  Psychiatric: She has a normal  mood and affect.  Nursing note and vitals reviewed.   BP 103/67 (BP Location: Left Arm, Patient Position: Sitting, Cuff Size: Normal)   Pulse 77   Temp 98.3 F (36.8 C) (Oral)   Resp 18   Ht '5\' 4"'$  (1.626 m)   Wt 137 lb (62.1 kg)   SpO2 96%   BMI 23.52 kg/m  Wt Readings from Last 3 Encounters:  06/28/17 137 lb (62.1 kg)  09/24/13 128 lb 12.8 oz (58.4 kg)  11/05/11 115 lb (52.2 kg)    Immunization History  Administered Date(s) Administered  . Tdap 06/28/2017    Diabetic Foot Exam - Simple   No data filed      Lab Results  Component Value Date   TSH 0.306 (L) 06/28/2017   Lab Results  Component Value Date   WBC 4.4 06/28/2017   HGB 12.0 06/28/2017   HCT 39.2 06/28/2017   MCV 71 (L) 06/28/2017   PLT 335 06/28/2017   Lab Results  Component Value Date   NA 140 06/28/2017   K 4.7 06/28/2017   CO2 25 06/28/2017   GLUCOSE 89 06/28/2017   BUN 11 06/28/2017   CREATININE 0.75 06/28/2017   BILITOT 0.4 06/28/2017   ALKPHOS 65 06/28/2017   AST 15 06/28/2017   ALT 9 06/28/2017   PROT 7.6 06/28/2017   ALBUMIN 5.0 06/28/2017   CALCIUM 10.0 06/28/2017   Lab Results  Component Value Date   CHOL 137 06/28/2017   Lab Results  Component Value Date   HDL 64 06/28/2017   Lab Results  Component Value Date   LDLCALC 63 06/28/2017   Lab Results  Component Value Date   TRIG 50 06/28/2017   Lab Results  Component Value Date   CHOLHDL 2.1 06/28/2017   Lab Results  Component Value Date   HGBA1C 5.4 06/28/2017       Assessment & Plan:   Problem List Items Addressed This Visit    None    Visit Diagnoses    Annual physical exam    -  Primary   Relevant Orders   CMP14+EGFR (Completed)   Lipid Panel (Completed)   CBC with Differential (Completed)   Hemoglobin A1c (Completed)   TSH (Completed)   Vitamin D, 25-hydroxy (Completed)   HIV antibody (with reflex)   HEP, RPR, HIV Panel (Completed)   HSV(herpes simplex vrs) 1+2 ab-IgG (Completed)   Urine  cytology ancillary only (Completed)   Inability to conceive, female       Relevant Orders   FSH/LH (Completed)       Meds ordered this encounter  Medications  . albuterol (PROVENTIL HFA;VENTOLIN HFA) 108 (90 Base) MCG/ACT inhaler    Sig: Inhale 2 puffs into the lungs every 6 (six) hours as needed for wheezing or shortness of breath. For shortness of breath    Dispense:  1 Inhaler    Refill:  3    Order Specific Question:   Supervising Provider    AnswerTresa Garter [id number]  Follow up: Return in about 1 year (around 06/28/2018) for Annual physical.  Fredia Beets, FNP

## 2017-06-29 ENCOUNTER — Telehealth: Payer: Self-pay | Admitting: Family Medicine

## 2017-06-29 ENCOUNTER — Other Ambulatory Visit: Payer: Self-pay | Admitting: Family Medicine

## 2017-06-29 DIAGNOSIS — A599 Trichomoniasis, unspecified: Secondary | ICD-10-CM

## 2017-06-29 LAB — URINE CYTOLOGY ANCILLARY ONLY
Chlamydia: NEGATIVE
Neisseria Gonorrhea: NEGATIVE
Trichomonas: POSITIVE — AB

## 2017-06-29 MED ORDER — METRONIDAZOLE 500 MG PO TABS: 500.0000 mg | ORAL_TABLET | Freq: Once | ORAL | 0 refills | Status: AC

## 2017-06-29 NOTE — Telephone Encounter (Signed)
Patient was called and notified of positive trichomonas result.  She communicates understanding. Medication will be sent to pharmacy of patient's choice.

## 2017-06-30 ENCOUNTER — Encounter: Payer: Self-pay | Admitting: Family Medicine

## 2017-06-30 ENCOUNTER — Other Ambulatory Visit: Payer: Self-pay | Admitting: Family Medicine

## 2017-06-30 DIAGNOSIS — A599 Trichomoniasis, unspecified: Secondary | ICD-10-CM

## 2017-06-30 LAB — CMP14+EGFR
ALK PHOS: 65 IU/L (ref 39–117)
ALT: 9 IU/L (ref 0–32)
AST: 15 IU/L (ref 0–40)
Albumin/Globulin Ratio: 1.9 (ref 1.2–2.2)
Albumin: 5 g/dL (ref 3.5–5.5)
BILIRUBIN TOTAL: 0.4 mg/dL (ref 0.0–1.2)
BUN/Creatinine Ratio: 15 (ref 9–23)
BUN: 11 mg/dL (ref 6–20)
CHLORIDE: 100 mmol/L (ref 96–106)
CO2: 25 mmol/L (ref 20–29)
CREATININE: 0.75 mg/dL (ref 0.57–1.00)
Calcium: 10 mg/dL (ref 8.7–10.2)
GFR calc Af Amer: 127 mL/min/{1.73_m2} (ref >59)
GFR calc non Af Amer: 110 mL/min/{1.73_m2} (ref >59)
GLUCOSE: 89 mg/dL (ref 65–99)
Globulin, Total: 2.6 g/dL (ref 1.5–4.5)
Potassium: 4.7 mmol/L (ref 3.5–5.2)
SODIUM: 140 mmol/L (ref 134–144)
Total Protein: 7.6 g/dL (ref 6.0–8.5)

## 2017-06-30 LAB — LIPID PANEL
CHOLESTEROL TOTAL: 137 mg/dL (ref 100–199)
Chol/HDL Ratio: 2.1 ratio (ref 0.0–4.4)
HDL: 64 mg/dL (ref >39)
LDL CALC: 63 mg/dL (ref 0–99)
TRIGLYCERIDES: 50 mg/dL (ref 0–149)
VLDL Cholesterol Cal: 10 mg/dL (ref 5–40)

## 2017-06-30 LAB — VITAMIN D 25 HYDROXY (VIT D DEFICIENCY, FRACTURES): VIT D 25 HYDROXY: 11.5 ng/mL — AB (ref 30.0–100.0)

## 2017-06-30 LAB — HEP, RPR, HIV PANEL
HEP B S AG: NEGATIVE
HIV SCREEN 4TH GENERATION: NONREACTIVE
RPR: NONREACTIVE

## 2017-06-30 LAB — CBC WITH DIFFERENTIAL/PLATELET
BASOS: 1 %
Basophils Absolute: 0 10*3/uL (ref 0.0–0.2)
EOS (ABSOLUTE): 0.1 10*3/uL (ref 0.0–0.4)
EOS: 1 %
HEMATOCRIT: 39.2 % (ref 34.0–46.6)
HEMOGLOBIN: 12 g/dL (ref 11.1–15.9)
IMMATURE GRANS (ABS): 0 10*3/uL (ref 0.0–0.1)
Immature Granulocytes: 0 %
LYMPHS: 28 %
Lymphocytes Absolute: 1.3 10*3/uL (ref 0.7–3.1)
MCH: 21.7 pg — AB (ref 26.6–33.0)
MCHC: 30.6 g/dL — ABNORMAL LOW (ref 31.5–35.7)
MCV: 71 fL — AB (ref 79–97)
MONOCYTES: 7 %
Monocytes Absolute: 0.3 10*3/uL (ref 0.1–0.9)
NEUTROS ABS: 2.8 10*3/uL (ref 1.4–7.0)
Neutrophils: 63 %
Platelets: 335 10*3/uL (ref 150–379)
RBC: 5.53 x10E6/uL — ABNORMAL HIGH (ref 3.77–5.28)
RDW: 16.6 % — ABNORMAL HIGH (ref 12.3–15.4)
WBC: 4.4 10*3/uL (ref 3.4–10.8)

## 2017-06-30 LAB — HEMOGLOBIN A1C
Est. average glucose Bld gHb Est-mCnc: 108 mg/dL
Hgb A1c MFr Bld: 5.4 % (ref 4.8–5.6)

## 2017-06-30 LAB — HSV(HERPES SIMPLEX VRS) I + II AB-IGG
HSV 1 Glycoprotein G Ab, IgG: <0.91 index (ref 0.00–0.90)
HSV 2 Glycoprotein G Ab, IgG: <0.91 index (ref 0.00–0.90)

## 2017-06-30 LAB — FSH/LH
FSH: 5.1 m[IU]/mL
LH: 8.6 m[IU]/mL

## 2017-06-30 LAB — TSH: TSH: 0.306 u[IU]/mL — AB (ref 0.450–4.500)

## 2017-06-30 MED ORDER — METRONIDAZOLE 500 MG PO TABS: 2000.0000 mg | ORAL_TABLET | Freq: Once | ORAL | 0 refills | Status: AC

## 2017-06-30 MED FILL — metroNIDAZOLE 500 MG TABS: 500 | 1 days supply | Qty: 4 | Fill #0

## 2017-07-02 LAB — URINE CYTOLOGY ANCILLARY ONLY: Candida vaginitis: NEGATIVE

## 2017-07-03 ENCOUNTER — Telehealth: Payer: Self-pay | Admitting: Family Medicine

## 2017-07-03 NOTE — Telephone Encounter (Signed)
CMA call back to patient, patient requesting results  CMA stated to CMA that there are not ready yet we will be calling once we have them   Patient was aware and understood

## 2017-07-03 NOTE — Telephone Encounter (Signed)
Pt wants to know her lab results  . Please, call her back  Thank you

## 2017-07-04 ENCOUNTER — Other Ambulatory Visit: Payer: Self-pay | Admitting: Family Medicine

## 2017-07-04 DIAGNOSIS — B9689 Other specified bacterial agents as the cause of diseases classified elsewhere: Secondary | ICD-10-CM

## 2017-07-04 DIAGNOSIS — N760 Acute vaginitis: Secondary | ICD-10-CM

## 2017-07-04 DIAGNOSIS — E559 Vitamin D deficiency, unspecified: Secondary | ICD-10-CM

## 2017-07-04 MED ORDER — VITAMIN D (ERGOCALCIFEROL) 1.25 MG (50000 UNIT) PO CAPS
50000.0000 [IU] | ORAL_CAPSULE | ORAL | 0 refills | Status: AC
Start: 2017-07-04 — End: 2017-09-20

## 2017-07-04 MED ORDER — METRONIDAZOLE 500 MG PO TABS: 500.0000 mg | ORAL_TABLET | Freq: Two times a day (BID) | ORAL | 0 refills | Status: DC

## 2017-07-04 MED FILL — metroNIDAZOLE 500 MG TABS: 500 | 7 days supply | Qty: 14 | Fill #0

## 2017-07-04 MED FILL — VIT D2 1.25 MG (50,000 UNIT: 1.25 MG | 84 days supply | Qty: 12 | Fill #0

## 2017-07-04 NOTE — Telephone Encounter (Signed)
Patient verified DOB (patient was previously called and informed of positive trichomoniasis result.) Patient completed the 4 tablets in 1 dosing on Friday. [facility]. Patient is aware of HIV, Hepatitis B, and syphilis are all negative. -Herpes, Gonorrhea, Chlamydia, and Yeast were all negative. Bacterial vaginosis was positive. BV is caused by an overgrowth of germs in the vagina. To reduce your risk of developing BV don't douche, don't use scented soap or sprays, and use protection during sexual intercourse. Labs that evaluated your fluid and electrolyte balance are normal.  Increase your dietary iron intake. Good sources of iron include dark green leafy vegetables, meats, beans, and iron fortified cereals. Normal ovarian function Kidney function normal Liver function normal Thyroid function normal Diabetes screening normal. Vitamin D level was low. Vitamin D helps to keep bones strong. You were prescribed ergocalciferol (capsules) to increase your vitamin-d level. Recommend follow up 3 months. Patient expressed his understanding.

## 2017-07-06 NOTE — Telephone Encounter (Signed)
-----   Message from Lizbeth BarkMandesia R Hairston, FNP sent at 07/04/2017  2:33 PM EDT ----- (patient was previously called and informed of positive trichomoniasis result.) HIV, Hepatitis B, and syphilis are all negative. -Herpes, Gonorrhea, Chlamydia, and Yeast were all negative. Bacterial vaginosis was positive. BV is caused by an overgrowth of germs in the vagina. To reduce your risk of developing BV don't douche, don't use scented soap or sprays, and use protection during sexual intercourse. Labs that evaluated your fluid and electrolyte balance are normal.  Increase your dietary iron intake. Good sources of iron include dark green leafy vegetables, meats, beans, and iron fortified cereals. Normal ovarian function Kidney function normal Liver function normal Thyroid function normal Diabetes screening normal. Vitamin D level was low. Vitamin D helps to keep bones strong. You were prescribed ergocalciferol (capsules) to increase your vitamin-d level. Recommend follow up 3 months.

## 2017-07-06 NOTE — Telephone Encounter (Signed)
CMA call regarding lab results   Patient verify DOB  Patient was aware and understood  

## 2017-12-26 ENCOUNTER — Ambulatory Visit: Payer: Medicaid Other | Attending: Family Medicine | Admitting: Family Medicine

## 2017-12-26 ENCOUNTER — Other Ambulatory Visit (HOSPITAL_COMMUNITY)
Admission: RE | Admit: 2017-12-26 | Discharge: 2017-12-26 | Disposition: A | Discharge status: Home / Self Care | Payer: Medicaid Other | Source: Ambulatory Visit | Attending: Family Medicine | Admitting: Family Medicine

## 2017-12-26 ENCOUNTER — Encounter: Payer: Self-pay | Admitting: Family Medicine

## 2017-12-26 VITALS — BP 105/72 | HR 84 | Temp 99.0°F | Resp 18 | Ht 64.0 in | Wt 130.0 lb

## 2017-12-26 DIAGNOSIS — R8290 Unspecified abnormal findings in urine: Secondary | ICD-10-CM | POA: Insufficient documentation

## 2017-12-26 DIAGNOSIS — M25551 Pain in right hip: Secondary | ICD-10-CM

## 2017-12-26 DIAGNOSIS — Z79899 Other long term (current) drug therapy: Secondary | ICD-10-CM | POA: Insufficient documentation

## 2017-12-26 DIAGNOSIS — Z113 Encounter for screening for infections with a predominantly sexual mode of transmission: Secondary | ICD-10-CM | POA: Insufficient documentation

## 2017-12-26 DIAGNOSIS — N898 Other specified noninflammatory disorders of vagina: Secondary | ICD-10-CM | POA: Diagnosis not present

## 2017-12-26 LAB — POCT URINALYSIS DIPSTICK
Blood, UA: NEGATIVE
GLUCOSE UA: NEGATIVE
Nitrite, UA: NEGATIVE
Spec Grav, UA: 1.015 (ref 1.010–1.025)
Urobilinogen, UA: 2 E.U./dL — AB
pH, UA: 7 (ref 5.0–8.0)

## 2017-12-26 MED ORDER — METRONIDAZOLE 500 MG PO TABS: 500.0000 mg | ORAL_TABLET | Freq: Two times a day (BID) | ORAL | 0 refills | Status: DC

## 2017-12-26 NOTE — Progress Notes (Signed)
Subjective:  Patient ID: Kathleen Holland, female    DOB: 1990/12/28  Age: 27 y.o. MRN: [id number]  CC: Other   HPI Kathleen Holland presents for vaginal complaint.  She reports history of abnormal malodorous vaginal odor for 2.5 weeks.  Her husband is an Personnel officerelectrician and travels for periods of time.  She reports noticing symptoms after sexual intercourse with her husband.  Associated symptoms include white thin watery-like discharge.  She reports using over-the-counter feminine washes for symptoms with minimal relief. She also c/o muscle tension of the right hip. Symptoms onset was in October while walking with her son for Halloween.  Aggravated by prolonged walking.  She denies history of known injury.       Outpatient Medications Prior to Visit  Medication Sig Dispense Refill  . albuterol (PROVENTIL HFA;VENTOLIN HFA) 108 (90 Base) MCG/ACT inhaler Inhale 2 puffs into the lungs every 6 (six) hours as needed for wheezing or shortness of breath. For shortness of breath 1 Inhaler 3  . HYDROcodone-acetaminophen (NORCO/VICODIN) 5-325 MG per tablet Take 1 tablet by mouth every 6 (six) hours as needed for pain. (Patient not taking: Reported on 06/28/2017) 15 tablet 0  . ibuprofen (ADVIL,MOTRIN) 800 MG tablet Take 1 tablet (800 mg total) by mouth every 8 (eight) hours as needed. (Patient not taking: Reported on 06/28/2017) 21 tablet 0  . methocarbamol (ROBAXIN) 500 MG tablet Take 1 tablet (500 mg total) by mouth 2 (two) times daily. (Patient not taking: Reported on 06/28/2017) 20 tablet 0  . metroNIDAZOLE (FLAGYL) 500 MG tablet Take 1 tablet (500 mg total) by mouth 2 (two) times daily. 14 tablet 0  . naproxen (NAPROSYN) 500 MG tablet Take 1 tablet (500 mg total) by mouth 2 (two) times daily with a meal. (Patient not taking: Reported on 06/28/2017) 30 tablet 0   No facility-administered medications prior to visit.     ROS Review of Systems  Constitutional: Negative.   Respiratory: Negative.     Cardiovascular: Negative.   Gastrointestinal: Negative.   Genitourinary: Positive for vaginal discharge.  Musculoskeletal: Positive for arthralgias.  Skin: Negative.   Psychiatric/Behavioral: Negative.    Objective:  BP 105/72 (BP Location: Right Arm, Patient Position: Sitting, Cuff Size: Normal)   Pulse 84   Temp 99 F (37.2 C) (Oral)   Resp 18   Ht 5\' 4"  (1.626 m)   Wt 130 lb (59 kg)   LMP 12/09/2017   SpO2 97%   BMI 22.31 kg/m   BP/Weight 12/26/2017 06/28/2017 03/11/2016  Systolic BP 105 103 101  Diastolic BP 72 67 55  Wt. (Lbs) 130 137 -  BMI 22.31 23.52 -    Physical Exam  Constitutional: She appears well-developed and well-nourished.  Cardiovascular: Normal rate, regular rhythm, normal heart sounds and intact distal pulses.  Pulmonary/Chest: Effort normal and breath sounds normal.  Abdominal: Soft. Bowel sounds are normal. There is no tenderness.  Genitourinary:  Genitourinary Comments: Urine cytology.  Musculoskeletal:       Right hip: She exhibits normal range of motion.  Right hip pain.  Skin: Skin is warm and dry.  Psychiatric: She has a normal mood and affect.    Assessment & Plan:   1. Abnormal urine odor  - Urine cytology ancillary only - HEP, RPR, HIV Panel; Future - HSV(herpes simplex vrs) 1+2 ab-IgG; Future  2. Vaginal discharge  - metroNIDAZOLE (FLAGYL) 500 MG tablet; Take 1 tablet (500 mg total) by mouth 2 (two) times daily.  Dispense: 14 tablet;  Refill: 0  3. Vaginal odor  - Urinalysis Dipstick - metroNIDAZOLE (FLAGYL) 500 MG tablet; Take 1 tablet (500 mg total) by mouth 2 (two) times daily.  Dispense: 14 tablet; Refill: 0  4. Screening for STDs (sexually transmitted diseases)  - Urine cytology ancillary only - HEP, RPR, HIV Panel; Future - HSV(herpes simplex vrs) 1+2 ab-IgG; Future  5. Pain of right hip joint -NSAID for pain.        Follow-up: Return if symptoms worsen or fail to improve.   Lizbeth Bark FNP

## 2017-12-26 NOTE — Patient Instructions (Addendum)
Walk in lab visit for labs.  Vaginitis Vaginitis is an inflammation of the vagina. It can happen when the normal bacteria and yeast in the vagina grow too much. There are different types. Treatment will depend on the type you have. Follow these instructions at home:  Take all medicines as told by your doctor.  Keep your vagina area clean and dry. Avoid soap. Rinse the area with water.  Avoid washing and cleaning out the vagina (douching).  Do not use tampons or have sex (intercourse) until your treatment is done.  Wipe from front to back after going to the restroom.  Wear cotton underwear.  Avoid wearing underwear while you sleep until your vaginitis is gone.  Avoid tight pants. Avoid underwear or nylons without a cotton panel.  Take off wet clothing (such as a bathing suit) as soon as you can.  Use mild, unscented products. Avoid fabric softeners and scented: ? Feminine sprays. ? Laundry detergents. ? Tampons. ? Soaps or bubble baths.  Practice safe sex and use condoms. Get help right away if:  You have belly (abdominal) pain.  You have a fever or lasting symptoms for more than 2-3 days.  You have a fever and your symptoms suddenly get worse. This information is not intended to replace advice given to you by your health care provider. Make sure you discuss any questions you have with your health care provider. Document Released: 02/24/2009 Document Revised: 05/05/2016 Document Reviewed: 05/10/2012 Elsevier Interactive Patient Education  2017 [employer]

## 2017-12-27 ENCOUNTER — Other Ambulatory Visit: Payer: Self-pay | Admitting: Family Medicine

## 2017-12-27 ENCOUNTER — Other Ambulatory Visit: Payer: Medicaid Other

## 2017-12-27 DIAGNOSIS — M25551 Pain in right hip: Secondary | ICD-10-CM

## 2017-12-27 MED ORDER — IBUPROFEN 600 MG PO TABS: 600.0000 mg | ORAL_TABLET | Freq: Three times a day (TID) | ORAL | 0 refills | Status: DC | PRN

## 2017-12-28 ENCOUNTER — Telehealth: Payer: Self-pay | Admitting: Family Medicine

## 2017-12-28 NOTE — Telephone Encounter (Signed)
Patient complains of not feeling well with flagyl and is requesting an alternative.

## 2017-12-28 NOTE — Telephone Encounter (Signed)
Pt called in regards to  -metroNIDAZOLE (FLAGYL) 500 MG tablet  She says that it is not working with her, after taking the medication she did not feel good so she wants other options. Please follow up

## 2017-12-29 ENCOUNTER — Other Ambulatory Visit: Payer: Self-pay | Admitting: Family Medicine

## 2017-12-29 ENCOUNTER — Telehealth: Payer: Self-pay | Admitting: Family Medicine

## 2017-12-29 DIAGNOSIS — N898 Other specified noninflammatory disorders of vagina: Secondary | ICD-10-CM

## 2017-12-29 LAB — URINE CYTOLOGY ANCILLARY ONLY
CHLAMYDIA, DNA PROBE: NEGATIVE
NEISSERIA GONORRHEA: NEGATIVE
TRICH (WINDOWPATH): NEGATIVE

## 2017-12-29 MED ORDER — METRONIDAZOLE 0.75 % VA GEL: 1.0000 | Freq: Every day | VAGINAL | 0 refills | Status: DC

## 2017-12-29 NOTE — Telephone Encounter (Signed)
Called and spoke with patient she would like to try metronidazole cream. Medication sent to pharmacy on file. She communicates understanding and is agreeable to plan.

## 2017-12-29 NOTE — Telephone Encounter (Signed)
Completed.

## 2018-01-01 LAB — URINE CYTOLOGY ANCILLARY ONLY: CANDIDA VAGINITIS: NEGATIVE

## 2018-01-08 ENCOUNTER — Telehealth (INDEPENDENT_AMBULATORY_CARE_PROVIDER_SITE_OTHER): Payer: Self-pay | Admitting: *Deleted

## 2018-01-08 NOTE — Telephone Encounter (Signed)
Patient verified DOB Patient completed the vaginal gel for BV and has no concerns.

## 2018-01-08 NOTE — Telephone Encounter (Signed)
-----   Message from Lizbeth BarkMandesia R Hairston, FNP sent at 01/02/2018  1:35 PM EST ----- Bacterial vaginosis was positive. Complete metronidazole course. BV is caused by an overgrowth of germs in the vagina. To reduce your risk of developing BV don't douche, don't use scented soap or sprays, and use protection during sexual intercourse. Gonorrhea, Chlamydia, Yeast , and Trichomonas were all negative.

## 2018-01-22 ENCOUNTER — Ambulatory Visit: Payer: Medicaid Other

## 2018-03-07 ENCOUNTER — Encounter: Payer: Self-pay | Admitting: Nurse Practitioner

## 2018-03-07 ENCOUNTER — Ambulatory Visit: Payer: Managed Care, Other (non HMO) | Attending: Nurse Practitioner | Admitting: Nurse Practitioner

## 2018-03-07 VITALS — BP 118/79 | HR 74 | Temp 99.0°F | Ht 64.0 in | Wt 130.6 lb

## 2018-03-07 DIAGNOSIS — M25551 Pain in right hip: Secondary | ICD-10-CM | POA: Diagnosis not present

## 2018-03-07 DIAGNOSIS — Z791 Long term (current) use of non-steroidal anti-inflammatories (NSAID): Secondary | ICD-10-CM | POA: Insufficient documentation

## 2018-03-07 DIAGNOSIS — D649 Anemia, unspecified: Secondary | ICD-10-CM | POA: Insufficient documentation

## 2018-03-07 DIAGNOSIS — Z862 Personal history of diseases of the blood and blood-forming organs and certain disorders involving the immune mechanism: Secondary | ICD-10-CM

## 2018-03-07 DIAGNOSIS — R946 Abnormal results of thyroid function studies: Secondary | ICD-10-CM | POA: Insufficient documentation

## 2018-03-07 DIAGNOSIS — Z833 Family history of diabetes mellitus: Secondary | ICD-10-CM | POA: Insufficient documentation

## 2018-03-07 DIAGNOSIS — Z79899 Other long term (current) drug therapy: Secondary | ICD-10-CM | POA: Diagnosis not present

## 2018-03-07 DIAGNOSIS — Z809 Family history of malignant neoplasm, unspecified: Secondary | ICD-10-CM | POA: Diagnosis not present

## 2018-03-07 DIAGNOSIS — R7989 Other specified abnormal findings of blood chemistry: Secondary | ICD-10-CM | POA: Diagnosis not present

## 2018-03-07 DIAGNOSIS — J45909 Unspecified asthma, uncomplicated: Secondary | ICD-10-CM | POA: Diagnosis not present

## 2018-03-07 DIAGNOSIS — Z8249 Family history of ischemic heart disease and other diseases of the circulatory system: Secondary | ICD-10-CM | POA: Diagnosis not present

## 2018-03-07 MED ORDER — CYCLOBENZAPRINE HCL 5 MG PO TABS: 5.0000 mg | ORAL_TABLET | Freq: Three times a day (TID) | ORAL | 1 refills | Status: DC | PRN

## 2018-03-07 NOTE — Patient Instructions (Addendum)
Anemia Anemia is a condition in which you do not have enough red blood cells or hemoglobin. Hemoglobin is a substance in red blood cells that carries oxygen. When you do not have enough red blood cells or hemoglobin (are anemic), your body cannot get enough oxygen and your organs may not work properly. As a result, you may feel very tired or have other problems. What are the causes? Common causes of anemia include:  Excessive bleeding. Anemia can be caused by excessive bleeding inside or outside the body, including bleeding from the intestine or from periods in women.  Poor nutrition.  Long-lasting (chronic) kidney, thyroid, and liver disease.  Bone marrow disorders.  Cancer and treatments for cancer.  HIV (human immunodeficiency virus) and AIDS (acquired immunodeficiency syndrome).  Treatments for HIV and AIDS.  Spleen problems.  Blood disorders.  Infections, medicines, and autoimmune disorders that destroy red blood cells.  What are the signs or symptoms? Symptoms of this condition include:  Minor weakness.  Dizziness.  Headache.  Feeling heartbeats that are irregular or faster than normal (palpitations).  Shortness of breath, especially with exercise.  Paleness.  Cold sensitivity.  Indigestion.  Nausea.  Difficulty sleeping.  Difficulty concentrating.  Symptoms may occur suddenly or develop slowly. If your anemia is mild, you may not have symptoms. How is this diagnosed? This condition is diagnosed based on:  Blood tests.  Your medical history.  A physical exam.  Bone marrow biopsy.  Your health care provider may also check your stool (feces) for blood and may do additional testing to look for the cause of your bleeding. You may also have other tests, including:  Imaging tests, such as a CT scan or MRI.  Endoscopy.  Colonoscopy.  How is this treated? Treatment for this condition depends on the cause. If you continue to lose a lot of blood,  you may need to be treated at a hospital. Treatment may include:  Taking supplements of iron, vitamin B12, or folic acid.  Taking a hormone medicine (erythropoietin) that can help to stimulate red blood cell growth.  Having a blood transfusion. This may be needed if you lose a lot of blood.  Making changes to your diet.  Having surgery to remove your spleen.  Follow these instructions at home:  Take over-the-counter and prescription medicines only as told by your health care provider.  Take supplements only as told by your health care provider.  Follow any diet instructions that you were given.  Keep all follow-up visits as told by your health care provider. This is important. Contact a health care provider if:  You develop new bleeding anywhere in the body. Get help right away if:  You are very weak.  You are short of breath.  You have pain in your abdomen or chest.  You are dizzy or feel faint.  You have trouble concentrating.  You have bloody or black, tarry stools.  You vomit repeatedly or you vomit up blood. Summary  Anemia is a condition in which you do not have enough red blood cells or enough of a substance in your red blood cells that carries oxygen (hemoglobin).  Symptoms may occur suddenly or develop slowly.  If your anemia is mild, you may not have symptoms.  This condition is diagnosed with blood tests as well as a medical history and physical exam. Other tests may be needed.  Treatment for this condition depends on the cause of the anemia. This information is not intended to replace advice   given to you by your health care provider. Make sure you discuss any questions you have with your health care provider. Document Released: 01/05/2005 Document Revised: 12/30/2016 Document Reviewed: 12/30/2016 Elsevier Interactive Patient Education  2018 [place] Hormone Test Why am I having this test? A thyroid-stimulating hormone (TSH)  test is a blood test that is done to measure the level of TSH, also known as thyrotropin, in your blood. TSH is produced by the pituitary gland. The pituitary gland is a small organ located just below the brain, behind your eyes and nasal passages. It is part of a system that monitors and maintains thyroid hormone levels and thyroid gland function. Thyroid hormones affect many body parts and systems, including the system that affects how quickly your body burns fuel for energy. Your health care provider may recommend testing your TSH level if you have signs and symptoms of abnormal thyroid hormone levels. Knowing the level of TSH in your blood can help your health care provider:  Diagnose a thyroid gland or pituitary gland disorder.  Manage your condition and treatment if you have hypothyroidism or hyperthyroidism.  What kind of sample is taken? A blood sample is required for this test. It is usually collected by inserting a needle into a vein. How do I prepare for this test? There is no preparation required for this test. What are the reference ranges? Reference rangesare considered healthy rangesestablished after testing a large group of healthy people. Reference rangesmay vary among different people, labs, and hospitals. It is your responsibility to obtain your test results. Ask the lab or department performing the test when and how you will get your results. Range of Normal Values:  Adult: 0.3-5 microunits/mL or 0.3-5 milliunits/L (SI units).  Newborn: 72-18 microunits/mL or 3-18 milliunits/L.  Cord: 3-12 microunits/mL or 3-12 milliunits/L.  What do the results mean? A high level of TSH may mean:  Your thyroid gland is not making enough thyroid hormones. When the thyroid gland does not make enough thyroid hormones, the pituitary gland releases TSH into the bloodstream. The higher-than-normal levels of TSH prompt the thyroid gland to release more thyroid hormones.  You are getting an  insufficient level of thyroid hormone medicine, if you are receiving this type of treatment.  There is a problem with the pituitary gland (rare).  A low level of TSH can indicate a problem with the pituitary gland. Talk with your health care provider to discuss your results, treatment options, and if necessary, the need for more tests. Talk with your health care provider if you have any questions about your results. Talk with your health care provider to discuss your results, treatment options, and if necessary, the need for more tests. Talk with your health care provider if you have any questions about your results. This information is not intended to replace advice given to you by your health care provider. Make sure you discuss any questions you have with your health care provider. Document Released: 12/23/2004 Document Revised: 07/31/2016 Document Reviewed: 04/23/2014 Elsevier Interactive Patient Education  [employer].

## 2018-03-07 NOTE — Progress Notes (Signed)
Assessment & Plan:  Kathleen Holland was seen today for establish care and pain.  Diagnoses and all orders for this visit:  History of anemia -     CBC -     Iron, TIBC and Ferritin Panel  Abnormal TSH -     Thyroid Panel With TSH  Right hip pain -     cyclobenzaprine (FLEXERIL) 5 MG tablet; Take 1 tablet (5 mg total) by mouth 3 (three) times daily as needed for muscle spasms.    Patient has been counseled on age-appropriate routine health concerns for screening and prevention. These are reviewed and up-to-date. Referrals have been placed accordingly. Immunizations are up-to-date or declined.    Subjective:   Chief Complaint  Patient presents with  . Establish Care    Patient is here to establish care and would like to check for Anemia   . Pain    Pt. stated her right lower hip hurts when she stretch, and when she gets up, and when she sleeps.    HPI Kathleen Holland 27 y.o. female presents to office today to establish care. She is requesting to be checked for anemia. Symptoms include difficulty staying warm. She also has a history of abnormal TSH with irregular menstrual cycles.    Right Hip Pain Onset several months ago after prolonged walking for a long period of time on the night of Halloween night. She currently describes the pain as aching in her right groin. Aggravating factors:lying or sitting. Pain lasts for a few minutes at a time and then resolves on its own. Wakes her up at night. Relieving factors: stretching.   Asthma Stable controlled. Endorses minimal use of albuterol inhaler.   Review of Systems  Constitutional: Positive for chills. Negative for fever, malaise/fatigue and weight loss.  HENT: Negative.  Negative for nosebleeds.   Eyes: Negative.  Negative for blurred vision, double vision and photophobia.  Respiratory: Negative.  Negative for cough and shortness of breath.   Cardiovascular: Negative.  Negative for chest pain, palpitations and leg swelling.    Gastrointestinal: Negative.  Negative for heartburn, nausea and vomiting.  Genitourinary:       Metrorrhagia  Musculoskeletal: Positive for myalgias. Negative for back pain, falls and neck pain.  Neurological: Negative.  Negative for dizziness, focal weakness, seizures and headaches.  Psychiatric/Behavioral: Negative.  Negative for suicidal ideas.    Past Medical History:  Diagnosis Date  . Asthma     History reviewed. No pertinent surgical history.  Family History  Problem Relation Age of Onset  . Cancer Maternal Aunt   . Diabetes Maternal Aunt   . Hypertension Maternal Grandmother   . Diabetes Maternal Grandmother   . Diabetes Maternal Grandfather   . Hypertension Mother     Social History Reviewed with no changes to be made today.   Outpatient Medications Prior to Visit  Medication Sig Dispense Refill  . albuterol (PROVENTIL HFA;VENTOLIN HFA) 108 (90 Base) MCG/ACT inhaler Inhale 2 puffs into the lungs every 6 (six) hours as needed for wheezing or shortness of breath. For shortness of breath 1 Inhaler 3  . ibuprofen (ADVIL,MOTRIN) 600 MG tablet Take 1 tablet (600 mg total) by mouth every 8 (eight) hours as needed for moderate pain. Take with food. (Patient not taking: Reported on 03/07/2018) 30 tablet 0  . metroNIDAZOLE (METROGEL) 0.75 % vaginal gel Place 1 Applicatorful vaginally at bedtime. For 5 days. 70 g 0   No facility-administered medications prior to visit.  No Known Allergies     Objective:    BP 118/79 (BP Location: Left Arm, Patient Position: Sitting, Cuff Size: Normal)   Pulse 74   Temp 99 F (37.2 C) (Oral)   Ht 5\' 4"  (1.626 m)   Wt 130 lb 9.6 oz (59.2 kg)   LMP 02/08/2018   SpO2 100%   BMI 22.42 kg/m  Wt Readings from Last 3 Encounters:  03/07/18 130 lb 9.6 oz (59.2 kg)  12/26/17 130 lb (59 kg)  06/28/17 137 lb (62.1 kg)    Physical Exam  Constitutional: She is oriented to person, place, and time. She appears well-developed and  well-nourished. She is cooperative.  HENT:  Head: Normocephalic and atraumatic.  Eyes: EOM are normal.  Neck: Normal range of motion.  Cardiovascular: Normal rate, regular rhythm and normal heart sounds. Exam reveals no gallop and no friction rub.  No murmur heard. Pulmonary/Chest: Effort normal and breath sounds normal. No tachypnea. No respiratory distress. She has no decreased breath sounds. She has no wheezes. She has no rhonchi. She has no rales. She exhibits no tenderness.  Abdominal: Soft. Bowel sounds are normal.  Musculoskeletal: Normal range of motion. She exhibits no edema, tenderness or deformity.       Right hip: She exhibits normal range of motion and normal strength.       Legs: Neurological: She is alert and oriented to person, place, and time. Coordination normal.  Skin: Skin is warm and dry.  Psychiatric: She has a normal mood and affect. Her behavior is normal. Judgment and thought content normal.  Nursing note and vitals reviewed.      Patient has been counseled extensively about nutrition and exercise as well as the importance of adherence with medications and regular follow-up. The patient was given clear instructions to go to ER or return to medical center if symptoms don't improve, worsen or new problems develop. The patient verbalized understanding.   Follow-up: Return if symptoms worsen or fail to improve.   Claiborne RiggZelda W Fleming, FNP-BC [facility] [place], [place] [phone]   03/07/2018, 10:27 PM

## 2018-03-08 LAB — CBC
Hematocrit: 36.6 % (ref 34.0–46.6)
Hemoglobin: 11.3 g/dL (ref 11.1–15.9)
MCH: 21.2 pg — AB (ref 26.6–33.0)
MCHC: 30.9 g/dL — ABNORMAL LOW (ref 31.5–35.7)
MCV: 69 fL — AB (ref 79–97)
PLATELETS: 311 10*3/uL (ref 150–379)
RBC: 5.33 x10E6/uL — ABNORMAL HIGH (ref 3.77–5.28)
RDW: 17 % — ABNORMAL HIGH (ref 12.3–15.4)
WBC: 5.9 10*3/uL (ref 3.4–10.8)

## 2018-03-08 LAB — IRON,TIBC AND FERRITIN PANEL
Ferritin: 8 ng/mL — ABNORMAL LOW (ref 15–150)
IRON: 53 ug/dL (ref 27–159)
Iron Saturation: 11 % — ABNORMAL LOW (ref 15–55)
Total Iron Binding Capacity: 468 ug/dL — ABNORMAL HIGH (ref 250–450)
UIBC: 415 ug/dL (ref 131–425)

## 2018-03-08 LAB — THYROID PANEL WITH TSH
Free Thyroxine Index: 1.5 (ref 1.2–4.9)
T3 Uptake Ratio: 23 % — ABNORMAL LOW (ref 24–39)
T4 TOTAL: 6.4 ug/dL (ref 4.5–12.0)
TSH: 0.689 u[IU]/mL (ref 0.450–4.500)

## 2018-03-08 MED FILL — CYCLOBENZAPRINE 5 MG TABLET: 5 | 10 days supply | Qty: 30 | Fill #0

## 2018-03-12 ENCOUNTER — Encounter: Payer: Medicaid Other | Admitting: Family Medicine

## 2018-04-10 ENCOUNTER — Telehealth: Payer: Self-pay | Admitting: Nurse Practitioner

## 2018-04-10 NOTE — Telephone Encounter (Signed)
Patient called and requested to speak with pcp nurse regarding personal issues. Please fu at your earliest convenience.

## 2018-04-11 NOTE — Telephone Encounter (Signed)
Please make a lab appointment for additional testing to make sure there is no infection that could be causing your bleeding irregularity (Urine cytology for GC/Chlamydia, Trich, BV, Yeast etc.  If your labs are normal it may be a good idea to think about starting birth control pills to regulate your cycles.

## 2018-04-11 NOTE — Telephone Encounter (Signed)
Patient called concerning about her recent period being irregular. Patient want PCP advice.

## 2018-04-13 NOTE — Telephone Encounter (Signed)
CMA called patient to inform above. Patient understood.

## 2018-05-08 ENCOUNTER — Encounter: Payer: Self-pay | Admitting: Nurse Practitioner

## 2018-05-10 ENCOUNTER — Other Ambulatory Visit: Payer: Self-pay | Admitting: Nurse Practitioner

## 2018-05-10 DIAGNOSIS — N921 Excessive and frequent menstruation with irregular cycle: Secondary | ICD-10-CM

## 2018-05-15 ENCOUNTER — Other Ambulatory Visit: Payer: Self-pay

## 2018-05-15 ENCOUNTER — Telehealth: Payer: Self-pay | Admitting: Obstetrics and Gynecology

## 2018-05-15 ENCOUNTER — Encounter: Payer: Self-pay | Admitting: Obstetrics and Gynecology

## 2018-05-15 ENCOUNTER — Ambulatory Visit (INDEPENDENT_AMBULATORY_CARE_PROVIDER_SITE_OTHER): Payer: Managed Care, Other (non HMO) | Admitting: Obstetrics and Gynecology

## 2018-05-15 ENCOUNTER — Other Ambulatory Visit (HOSPITAL_COMMUNITY)
Admission: RE | Admit: 2018-05-15 | Discharge: 2018-05-15 | Disposition: A | Discharge status: Home / Self Care | Payer: Managed Care, Other (non HMO) | Source: Ambulatory Visit | Attending: Obstetrics and Gynecology | Admitting: Obstetrics and Gynecology

## 2018-05-15 VITALS — BP 98/60 | HR 76 | Resp 16 | Ht 65.0 in | Wt 127.0 lb

## 2018-05-15 DIAGNOSIS — Z3169 Encounter for other general counseling and advice on procreation: Secondary | ICD-10-CM

## 2018-05-15 DIAGNOSIS — Z124 Encounter for screening for malignant neoplasm of cervix: Secondary | ICD-10-CM

## 2018-05-15 DIAGNOSIS — N979 Female infertility, unspecified: Secondary | ICD-10-CM | POA: Diagnosis not present

## 2018-05-15 DIAGNOSIS — N6452 Nipple discharge: Secondary | ICD-10-CM

## 2018-05-15 DIAGNOSIS — N926 Irregular menstruation, unspecified: Secondary | ICD-10-CM | POA: Diagnosis not present

## 2018-05-15 LAB — POCT URINE PREGNANCY: PREG TEST UR: NEGATIVE

## 2018-05-15 NOTE — Telephone Encounter (Signed)
[facility] with [facility]. Received order for [facility] Hysterosalpingogram. If diagnostic HSG, not done at [facility], place [facility].    Reviewed OV notes dated 05/15/18 and new order placed [facility].    Routing to Dr. Edward JollySilva for final review, will close encounter.

## 2018-05-15 NOTE — Telephone Encounter (Signed)
Tiffany at [facility] is asking talk with Dr.Silva's nurse about an order on this patient.

## 2018-05-15 NOTE — Progress Notes (Signed)
GYNECOLOGY  VISIT   HPI: 27 y.o.   Single  African American  female   [phone] with Patient's last menstrual period was 05/02/2018.   Here as a new patient referred from Geryl Rankins, NP for metrorrhagia and desire for pregnancy. UPT negative today.      Unprotected intercourse for 3 years.  Same partner for 6 years.  Partner has fathered children - 4.   2 VIPs. No complications.  Worried that these terminations have affected her fertility.  Menses are irregular x a few months.  Range can be 26 - 32 days.  Last for 4 - 6 days.  Had spotting for 8 days prior to menses on May. Started spotting again on 05/08/18.  Some left sided pain which has not accompanied her bleeding.  Thinks that she is ovulating.  Monitoring her cervical mucous.   Weight stable within 3 pounds.  No strenuous exercise.  So significant stress.   No nipple discharge.  Has some bilateral discharge with expression from her nipples - yellowish/whitish.   Had TFTs with her PCP, and this was normal.   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   GYNECOLOGIC HISTORY: Patient's last menstrual period was 05/02/2018. Contraception:  none Menopausal hormone therapy:  n/a Last mammogram:  n/a Last pap smear:   05/20/15 Negative        OB History    Gravida  3   Para  1   Term  0   Preterm  0   AB  2   Living  1     SAB  0   TAB  0   Ectopic  0   Multiple  0   Live Births  0              There are no active problems to display for this patient.   Past Medical History:  Diagnosis Date  . Anemia   . Asthma   . Thyroid disease   . Trichimoniasis     History reviewed. No pertinent surgical history.  Current Outpatient Medications  Medication Sig Dispense Refill  . albuterol (PROVENTIL HFA;VENTOLIN HFA) 108 (90 Base) MCG/ACT inhaler Inhale 2 puffs into the lungs every 6 (six) hours as needed for wheezing or shortness of breath. For shortness of breath 1 Inhaler 3   No current  facility-administered medications for this visit.      ALLERGIES: Patient has no known allergies.  Family History  Problem Relation Age of Onset  . Diabetes Maternal Aunt   . Ovarian cancer Maternal Aunt   . Hypertension Maternal Grandmother   . Diabetes Maternal Grandmother   . Diabetes Maternal Grandfather   . Hypertension Mother     Social History   Socioeconomic History  . Marital status: Single    Spouse name: Not on file  . Number of children: Not on file  . Years of education: Not on file  . Highest education level: Not on file  Occupational History  . Not on file  Social Needs  . Financial resource strain: Not on file  . Food insecurity:    Worry: Not on file    Inability: Not on file  . Transportation needs:    Medical: Not on file    Non-medical: Not on file  Tobacco Use  . Smoking status: Never Smoker  . Smokeless tobacco: Never Used  Substance and Sexual Activity  . Alcohol use: No  . Drug use: Never  .  Sexual activity: Yes    Birth control/protection: None  Lifestyle  . Physical activity:    Days per week: Not on file    Minutes per session: Not on file  . Stress: Not on file  Relationships  . Social connections:    Talks on phone: Not on file    Gets together: Not on file    Attends religious service: Not on file    Active member of club or organization: Not on file    Attends meetings of clubs or organizations: Not on file    Relationship status: Not on file  . Intimate partner violence:    Fear of current or ex partner: Not on file    Emotionally abused: Not on file    Physically abused: Not on file    Forced sexual activity: Not on file  Other Topics Concern  . Not on file  Social History Narrative   Boyfriend who is an Clinical biochemist.    Review of Systems  Constitutional: Negative.   HENT: Negative.   Eyes: Negative.   Respiratory: Negative.   Cardiovascular: Negative.   Gastrointestinal: Negative.   Endocrine: Negative.    Genitourinary:       Irregular menses  Musculoskeletal: Negative.   Skin: Negative.   Allergic/Immunologic: Negative.   Neurological: Negative.   Hematological: Negative.   Psychiatric/Behavioral: Negative.     PHYSICAL EXAMINATION:    BP 98/60 (BP Location: Right Arm, Patient Position: Sitting, Cuff Size: Normal)   Pulse 76   Resp 16   Ht _0  (1.651 m)   Wt 127 lb (57.6 kg)   LMP 05/02/2018   BMI 21.13 kg/m     General appearance: alert, cooperative and appears stated age Head: Normocephalic, without obvious abnormality, atraumatic Neck: no adenopathy, supple, symmetrical, trachea midline and thyroid normal to inspection and palpation Lungs: clear to auscultation bilaterally Breasts: normal appearance, no masses or tenderness, No nipple retraction or dimpling, No nipple discharge or bleeding, No axillary or supraclavicular adenopathy Heart: regular rate and rhythm Abdomen: soft, non-tender, no masses,  no organomegaly Extremities: extremities normal, atraumatic, no cyanosis or edema Skin: Skin color, texture, turgor normal. No rashes or lesions Lymph nodes: Cervical, supraclavicular, and axillary nodes normal. No abnormal inguinal nodes palpated Neurologic: Grossly normal  Pelvic: External genitalia:  no lesions              Urethra:  normal appearing urethra with no masses, tenderness or lesions              Bartholins and Skenes: normal                 Vagina: normal appearing vagina with normal color and discharge, no lesions              Cervix: no lesions.  Ovulation type mucous.                 Bimanual Exam:  Uterus:  normal size, contour, position, consistency, mobility, non-tender              Adnexa: no mass, fullness, tenderness               Chaperone was present for exam.  ASSESSMENT  Secondary infertility.  Nipple discharge.  Irregular menses.  Cervical cancer screening.  Hx iron deficiency anemia.   PLAN  Recommend PNV.  Avoid exposures.   Will do SA, HSG, and progesterone on day number 23.  We did discuss ovulation kits and how  they work.  SA kit for partner Hilbert Bible.  Check Prolactin, LH, FSH, estradiol. FU pap.  She will call with her next menses to schedule the HSG.  Procedure explained.    An After Visit Summary was printed and given to the patient.  _45_____ minutes face to face time of which over 50% was spent in counseling and coordination of care.

## 2018-05-15 NOTE — Patient Instructions (Signed)
You will need to have your blood drawn to check your progesterone level on day number 23 of your next menstruation.  Day number 1 is the first day you bleed.    Please also call the office with your next menstruation so we can schedule your hysterosalpingogram to test your fallopian tubes for blockage and test your uterus for scaring.   Infertility Infertility is when you are unable to get pregnant (conceive) after a year of having sex regularly without using birth control. Infertility can also mean that a woman is not able to carry a pregnancy to full term. Both women and men can have fertility problems. What causes infertility? What Causes Infertility in Women? There are many possible causes of infertility in women. For some women, the cause of infertility is not known (unexplained infertility). Infertility can also be linked to more than one cause. Infertility problems in women can be caused by problems with the menstrual cycle or reproductive organs, certain medical conditions, and factors related to lifestyle and age.  Problems with your menstrual cycle can interfere with your ovaries producing eggs (ovulation). This can make it difficult to get pregnant. This includes having a menstrual cycle that is very long, very short, or irregular.  Problems with reproductive organs can include: ? An abnormally narrow cervix or a cervix that does not remain closed during a pregnancy. ? A blockage in your fallopian tubes. ? An abnormally shaped uterus. ? Uterine fibroids. This is a tissue mass (tumor) that can develop on your uterus.  Medical conditions that can affect a woman's fertility include: ? Polycystic ovarian syndrome (PCOS). This is a hormonal disorder that can cause small cysts to grow on your ovaries. This is the most common cause of infertility in women. ? Endometriosis. This is a condition in which the tissue that lines your uterus (endometrium) grows outside of its normal  location. ? Primary ovary insufficiency. This is when your ovaries stop producing eggs and hormones before the age of 27. ? Sexually transmitted diseases, such as chlamydia or gonorrhea. These infections can cause scarring in your fallopian tubes. This makes it difficult for eggs to reach your uterus. ? Autoimmune disorders. These are disorders in which your immune system attacks normal, healthy cells. ? Hormone imbalances.  Other factors include: ? Age. A woman's fertility declines with age, especially after her mid-30s. ? Being under- or overweight. ? Drinking too much alcohol. ? Using drugs. ? Exercising excessively. ? Being exposed to environmental toxins, such as radiation, pesticides, and certain chemicals.  What Causes Infertility in Men? There are many causes of infertility in men. Infertility can be linked to more than one cause. Infertility problems in men can be caused by problems with sperm or the reproductive organs, certain medical conditions, and factors related to lifestyle and age. Some men have unexplained infertility.  Problems with sperm. Infertility can result if there is a problem producing: ? Enough sperm (low sperm count). ? Enough normally-shaped sperm (sperm morphology). ? Sperm that are able to reach the egg (poor motility).  Infertility can also be caused by: ? A problem with hormones. ? Enlarged veins (varicoceles), cysts (spermatoceles), or tumors of the testicles. ? Sexual dysfunction. ? Injury to the testicles. ? A birth defect, such as not having the tubes that carry sperm (vas deferens).  Medical conditions that can affect a man's fertility include: ? Diabetes. ? Cancer treatments, such as chemotherapy or radiation. ? Klinefelter syndrome. This is an inherited genetic disorder. ? Thyroid  problems, such as an under- or overactive thyroid. ? Cystic fibrosis. ? Sexually transmitted diseases.  Other factors include: ? Age. A man's fertility declines  with age. ? Drinking too much alcohol. ? Using drugs. ? Being exposed to environmental toxins, such as pesticides and lead.  What are the symptoms of infertility? Being unable to get pregnant after one year of having regular sex without using birth control is the only sign of infertility. How is infertility diagnosed? In order to be diagnosed with infertility, both partners will have a physical exam. Both partners will also have an extensive medical and sexual history taken. If there is no obvious reason for infertility, additional tests may be done. What Tests Will Women Have? Women may first have tests to check whether they are ovulating each month. The tests may include:  Blood tests to check hormone levels.  An ultrasound of the ovaries. This looks for possible problems on or in the ovaries.  Taking a small sample of the tissue that lines the uterus for examination under a microscope (endometrial biopsy).  Women who are ovulating may have additional tests. These may include:  Hysterosalpingography. ? This is an X-ray of the fallopian tubes and uterus taken after a specific type of dye is injected. ? This test can show the shape of the uterus and whether the fallopian tubes are open.  Laparoscopy. ? In this test, a lighted tube (laparoscope) is used to look for problems in the fallopian tubes and other female organs.  Transvaginal ultrasound. ? This is an imaging test to check for abnormalities of the uterus and ovaries. ? A health care provider can use this test to count the number of follicles on the ovaries.  Hysteroscopy. ? This test involves using a lighted tube to examine the cervix and inside the uterus. ? It is done to find any abnormalities inside the uterus.  What Tests Will Men Have? Tests for men's infertility includes:  Semen tests to check sperm count, morphology, and motility.  Blood tests to check for hormone levels.  Taking a small sample of tissue from  inside a testicle (biopsy). This is examined under a microscope.  Blood tests to check for genetic abnormalities (genetic testing).  How are women treated for infertility? Treatment depends on the cause of infertility. Most cases of infertility in women are treated with medicine or surgery.  Women may take medicine to: ? Correct ovulation problems. ? Treat other health conditions, such as PCOS.  Surgery may be done to: ? Repair damage to the ovaries, fallopian tubes, cervix, or uterus. ? Remove growths from the uterus. ? Remove scar tissue from the uterus, pelvis, or other female organs.  How are men treated for infertility? Treatment depends on the cause of infertility. Most cases of infertility in men are treated with medicine or surgery.  Men may take medicine to: ? Correct hormone problems. ? Treat other health conditions. ? Treat sexual dysfunction.  Surgery may be done to: ? Remove blockages in the reproductive tract. ? Correct other structural problems of the reproductive tract.  What is assisted reproductive technology? Assisted reproductive technology (ART) refers to all treatments and procedures that combine eggs and sperm outside the body to try to help a couple conceive. ART is often combined with fertility drugs to stimulate ovulation. Sometimes ART is done using eggs retrieved from another woman's body (donor eggs) or from previously frozen fertilized eggs (embryos). There are different types of ART. These include:  Intrauterine insemination (IUI). ?  In this procedure, sperm is placed directly into a woman's uterus with a long, thin tube. ? This may be most effective for infertility caused by sperm problems, including low sperm count and low motility. ? Can be used in combination with fertility drugs.  In vitro fertilization (IVF). ? This is often done when a woman's fallopian tubes are blocked or when a man has low sperm counts. ? Fertility drugs stimulate the  ovaries to produce multiple eggs. Once mature, these eggs are removed from the body and combined with the sperm to be fertilized. ? These fertilized eggs are then placed in the woman's uterus.  This information is not intended to replace advice given to you by your health care provider. Make sure you discuss any questions you have with your health care provider. Document Released: 12/01/2003 Document Revised: 04/29/2016 Document Reviewed: 08/13/2014 Elsevier Interactive Patient Education  2018 [employer].

## 2018-05-16 DIAGNOSIS — N979 Female infertility, unspecified: Secondary | ICD-10-CM | POA: Insufficient documentation

## 2018-05-16 LAB — CYTOLOGY - PAP: Diagnosis: NEGATIVE

## 2018-05-16 LAB — RUBELLA SCREEN: RUBELLA: 3.39 {index} (ref >0.99)

## 2018-05-16 LAB — FSH/LH
FSH: 5.4 m[IU]/mL
LH: 32.2 m[IU]/mL

## 2018-05-16 LAB — PROLACTIN: PROLACTIN: 13.5 ng/mL (ref 4.8–23.3)

## 2018-05-23 ENCOUNTER — Other Ambulatory Visit (INDEPENDENT_AMBULATORY_CARE_PROVIDER_SITE_OTHER): Payer: Managed Care, Other (non HMO)

## 2018-05-23 DIAGNOSIS — N979 Female infertility, unspecified: Secondary | ICD-10-CM

## 2018-05-23 DIAGNOSIS — N926 Irregular menstruation, unspecified: Secondary | ICD-10-CM

## 2018-05-24 LAB — PROGESTERONE: Progesterone: 9.9 ng/mL

## 2018-07-05 ENCOUNTER — Telehealth: Payer: Self-pay | Admitting: Obstetrics and Gynecology

## 2018-07-05 NOTE — Telephone Encounter (Signed)
Elevated TSH can contribute to skipped menses, but so can pregnancy.  If she had a faint positive UPT, I do recommend an office visit and a serum pregnancy test.

## 2018-07-05 NOTE — Telephone Encounter (Signed)
Ok for office visit as scheduled.  I agree with the precautions given.

## 2018-07-05 NOTE — Telephone Encounter (Signed)
Spoke with patient, advised as seen below per Dr. Edward JollySilva.   Patient states her menses started today after calling the office. Denies any pain or cramping.   Recommended OV for further evaluation of bleeding and faint positive pregnancy test. Patient declined OV for today and 7/26 stating she is unable to miss work.   OV scheduled for 7/29 at 9am with Dr. Edward JollySilva. ER precautions provided. Advised will review with Dr. Edward JollySilva and return call with any additional recommendations.   Dr. Richrd HumblesSilva-please review.

## 2018-07-05 NOTE — Telephone Encounter (Signed)
Reviewed plan wth Dr. Edward JollySilva, no additional recommendations. Encounter closed.

## 2018-07-05 NOTE — Telephone Encounter (Signed)
Patient states she is 8 days late for her cycle. She states she could be pregnant but has taken pregnancy tests that are negative. Patient also states her primary care checked her TSH level and it was low. Patient wondering if the low TSH can cause missed or late cycles. Please advise.

## 2018-07-05 NOTE — Telephone Encounter (Signed)
Spoke with patient. LMP 05/30/18. Reports intermittent nausea and frequent urination x 4 days. UPT on 7/23 neg. UPT x 2 on 7/24, one negative and one "faint positive".   Denies pain. No contraceptive, "trying to conceive". Asking if missed menses is related to low TSH?  Recommended OV for pregnancy confirmation, patient declined OV at this time. Patient states she needs to review her schedule and return call. Patient verbalizes understanding.   Routing to Dr. Marjorie SmolderSilva FYI.

## 2018-07-09 ENCOUNTER — Other Ambulatory Visit: Payer: Self-pay

## 2018-07-09 ENCOUNTER — Other Ambulatory Visit (HOSPITAL_COMMUNITY)
Admission: RE | Admit: 2018-07-09 | Discharge: 2018-07-09 | Disposition: A | Discharge status: Home / Self Care | Payer: Managed Care, Other (non HMO) | Source: Ambulatory Visit | Attending: Obstetrics and Gynecology | Admitting: Obstetrics and Gynecology

## 2018-07-09 ENCOUNTER — Ambulatory Visit (INDEPENDENT_AMBULATORY_CARE_PROVIDER_SITE_OTHER): Payer: Managed Care, Other (non HMO) | Admitting: Obstetrics and Gynecology

## 2018-07-09 ENCOUNTER — Encounter: Payer: Self-pay | Admitting: Obstetrics and Gynecology

## 2018-07-09 VITALS — BP 102/60 | HR 80 | Resp 16 | Ht 65.0 in | Wt 129.0 lb

## 2018-07-09 DIAGNOSIS — Z113 Encounter for screening for infections with a predominantly sexual mode of transmission: Secondary | ICD-10-CM | POA: Insufficient documentation

## 2018-07-09 DIAGNOSIS — N979 Female infertility, unspecified: Secondary | ICD-10-CM

## 2018-07-09 DIAGNOSIS — N926 Irregular menstruation, unspecified: Secondary | ICD-10-CM

## 2018-07-09 LAB — POCT URINE PREGNANCY: PREG TEST UR: NEGATIVE

## 2018-07-09 NOTE — Progress Notes (Signed)
GYNECOLOGY  VISIT   HPI: 27 y.o.   Single  African American  female   [phone] with Patient's last menstrual period was 07/05/2018.   here for irregular menses; per patient LMP was 1 week and 1 day late. Per patient, has had 2 negative UPT and 1 faint positive.   Prior LMP was 05/30/18. LMP was 8 days late, and this concerned her. This cycle was routine for her.  Had sharp RLQ pain prior to her menses and lasted for a few seconds and then moved to the LLQ and was dull.  All of this resolved.  Before her cycle, she had breast tenderness and nausea.  She is trying for pregnancy.  Doing ovulation kits, and she is ovulating.  Partner did not do SA. HSG not done yet.   Wants STD testing today.   GYNECOLOGIC HISTORY: Patient's last menstrual period was 07/05/2018. Contraception:  none Menopausal hormone therapy:  n/a Last mammogram:  n/a Last pap smear:   05/15/18 Negative        OB History    Gravida  3   Para  1   Term  0   Preterm  0   AB  2   Living  1     SAB  0   TAB  0   Ectopic  0   Multiple  0   Live Births  0              Patient Active Problem List   Diagnosis Date Noted  . 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 Anemia   . Asthma   . Thyroid disease   . Trichimoniasis     History reviewed. No pertinent surgical history.  Current Outpatient Medications  Medication Sig Dispense Refill  . albuterol (PROVENTIL HFA;VENTOLIN HFA) 108 (90 Base) MCG/ACT inhaler Inhale 2 puffs into the lungs every 6 (six) hours as needed for wheezing or shortness of breath. For shortness of breath 1 Inhaler 3  . Prenatal Vit-Fe Fumarate-FA (PRENATAL MULTIVITAMIN) TABS tablet Take 1 tablet by mouth daily at 12 noon.     No current facility-administered medications for this visit.      ALLERGIES: Patient has no known allergies.  Family History  Problem Relation Age of Onset  . Diabetes Maternal Aunt   . Ovarian  cancer Maternal Aunt   . Hypertension Maternal Grandmother   . Diabetes Maternal Grandmother   . Diabetes Maternal Grandfather   . Hypertension Mother     Social History   Socioeconomic History  . Marital status: Single    Spouse name: Not on file  . Number of children: Not on file  . Years of education: Not on file  . Highest education level: Not on file  Occupational History  . Not on file  Social Needs  . Financial resource strain: Not on file  . Food insecurity:    Worry: Not on file    Inability: Not on file  . Transportation needs:    Medical: Not on file    Non-medical: Not on file  Tobacco Use  . Smoking status: Never Smoker  . Smokeless tobacco: Never Used  Substance and Sexual Activity  . Alcohol use: No  . Drug use: Never  . Sexual activity: Yes    Birth control/protection: None  Lifestyle  . Physical activity:    Days per week: Not on file    Minutes per session: Not on file  .  Stress: Not on file  Relationships  . Social connections:    Talks on phone: Not on file    Gets together: Not on file    Attends religious service: Not on file    Active member of club or organization: Not on file    Attends meetings of clubs or organizations: Not on file    Relationship status: Not on file  . Intimate partner violence:    Fear of current or ex partner: Not on file    Emotionally abused: Not on file    Physically abused: Not on file    Forced sexual activity: Not on file  Other Topics Concern  . Not on file  Social History Narrative   Boyfriend who is an Personnel officerelectrician.    Review of Systems  Constitutional: Negative.   HENT: Negative.   Eyes: Negative.   Respiratory: Negative.   Cardiovascular: Negative.   Gastrointestinal: Positive for nausea.  Endocrine: Negative.   Genitourinary:       Menstrual cycle changes  Musculoskeletal: Negative.   Skin: Negative.   Allergic/Immunologic: Negative.   Neurological: Negative.   Hematological: Negative.    Psychiatric/Behavioral: Negative.     PHYSICAL EXAMINATION:    BP 102/60 (BP Location: Right Arm, Patient Position: Sitting, Cuff Size: Normal)   Pulse 80   Resp 16   Ht 5\' 5"  (1.651 m)   Wt 129 lb (58.5 kg)   LMP 07/05/2018   BMI 21.47 kg/m     General appearance: alert, cooperative and appears stated age   Pelvic: External genitalia:  no lesions              Urethra:  normal appearing urethra with no masses, tenderness or lesions              Bartholins and Skenes: normal                 Vagina: normal appearing vagina with normal color and discharge, no lesions              Cervix: no lesions.  Menstrual blood noted.                 Bimanual Exam:  Uterus:  normal size, contour, position, consistency, mobility, non-tender              Adnexa: no mass, fullness, tenderness       Chaperone was present for exam.  ASSESSMENT  Secondary infertility.  Faint positive home UPT, then turned negative.  STD screening.   PLAN  Full STD testing today.  Serum quant hCG.  We discussed reasons for early pregnancy loss if this did represent a chemical pregnancy. Continue PNV.  Partner to do SA. Schedule hysterosalpingogram.    An After Visit Summary was printed and given to the patient.  __25____ minutes face to face time of which over 50% was spent in counseling.

## 2018-07-09 NOTE — Progress Notes (Signed)
Scheduled for [facility] for 07/11/18 at 1400.

## 2018-07-10 LAB — HEP, RPR, HIV PANEL
HIV Screen 4th Generation wRfx: NONREACTIVE
Hepatitis B Surface Ag: NEGATIVE
RPR Ser Ql: NONREACTIVE

## 2018-07-10 LAB — HSV(HERPES SIMPLEX VRS) I + II AB-IGG

## 2018-07-10 LAB — HEPATITIS C ANTIBODY: Hep C Virus Ab: <0.1 s/co ratio (ref 0.0–0.9)

## 2018-07-10 LAB — BETA HCG QUANT (REF LAB): hCG Quant: <1 m[IU]/mL

## 2018-07-11 ENCOUNTER — Ambulatory Visit (HOSPITAL_COMMUNITY)
Admission: RE | Admit: 2018-07-11 | Discharge: 2018-07-11 | Disposition: A | Discharge status: Home / Self Care | Payer: Managed Care, Other (non HMO) | Source: Ambulatory Visit | Attending: Obstetrics and Gynecology | Admitting: Obstetrics and Gynecology

## 2018-07-11 DIAGNOSIS — N979 Female infertility, unspecified: Secondary | ICD-10-CM | POA: Insufficient documentation

## 2018-07-11 LAB — CERVICOVAGINAL ANCILLARY ONLY
CHLAMYDIA, DNA PROBE: NEGATIVE
Neisseria Gonorrhea: NEGATIVE
Trichomonas: NEGATIVE

## 2018-07-11 MED ORDER — IOPAMIDOL (ISOVUE-300) INJECTION 61%
30.0000 mL | Freq: Once | INTRAVENOUS | Status: AC | PRN
  Administered 2018-07-11: 16 mL

## 2019-05-22 ENCOUNTER — Ambulatory Visit: Payer: Medicaid Other | Admitting: Obstetrics and Gynecology

## 2020-01-12 IMAGING — RF DG HYSTEROGRAM
7 series · 11 of 11 positions shown · IV contrast (omnipaque)
Comparison: None

CLINICAL DATA: Infertility.

EXAM:
HYSTEROSALPINGOGRAM
TECHNIQUE: Following cleansing of the cervix and vagina with Betadine solution,
a hysterosalpingogram was performed using a 5-French
hysterosalpingogram catheter and Omnipaque 300 contrast. The patient
tolerated the examination without difficulty.

[Series 1: run · 5 of 5 slices shown (1 of 7)]
[im 1/5]
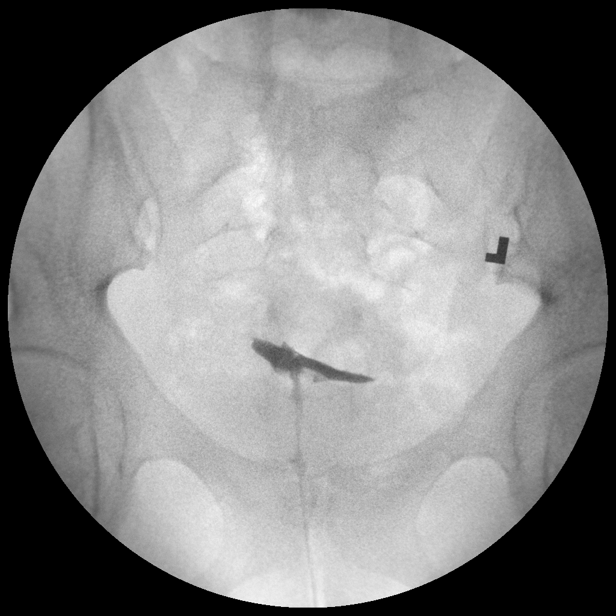
[im 2/5]
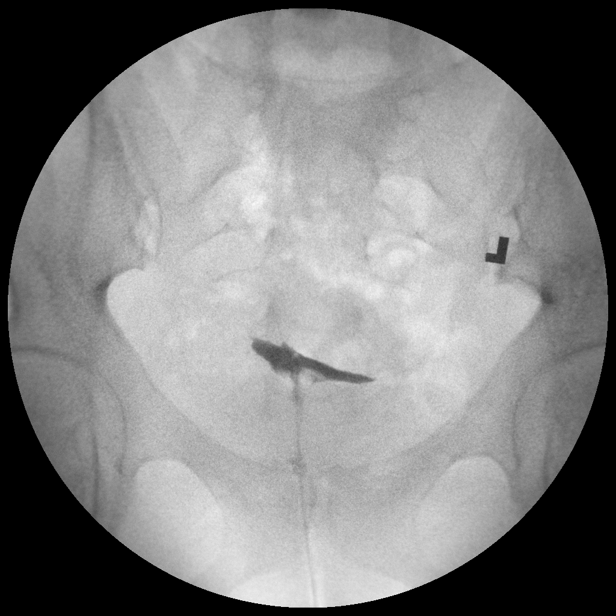
[im 3/5]
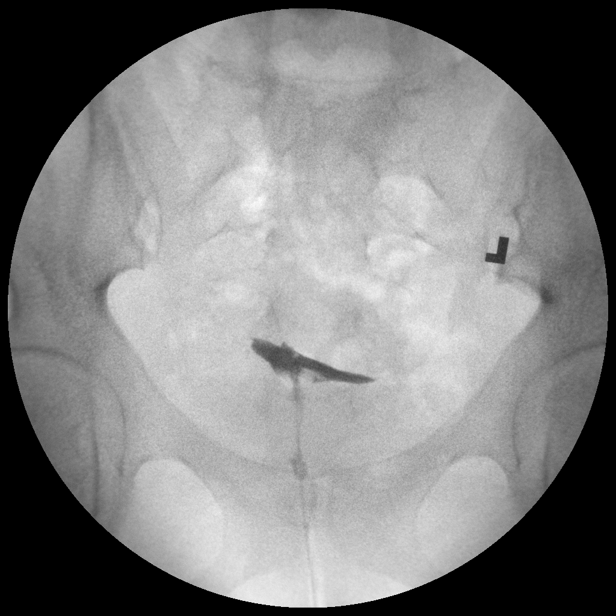
[im 4/5]
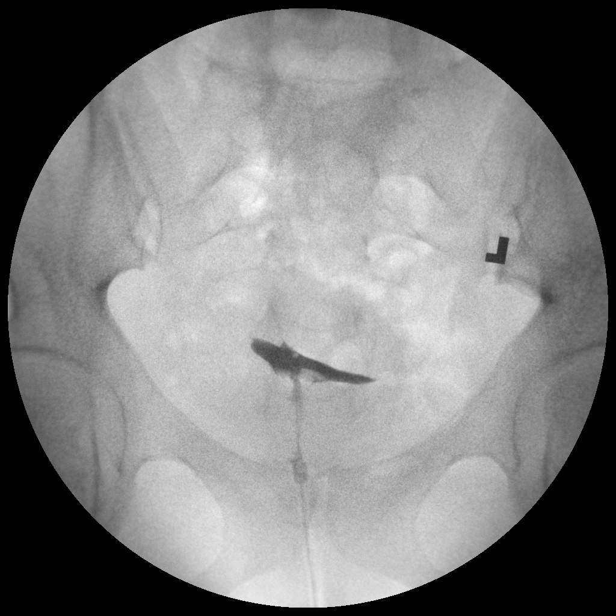
[im 5/5]
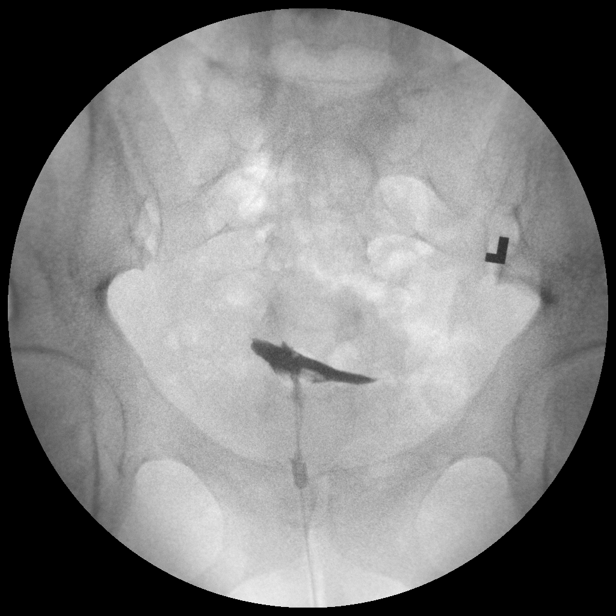

[Series 2: run · 1 of 1 slices shown (2 of 7)]
[im 1/1]
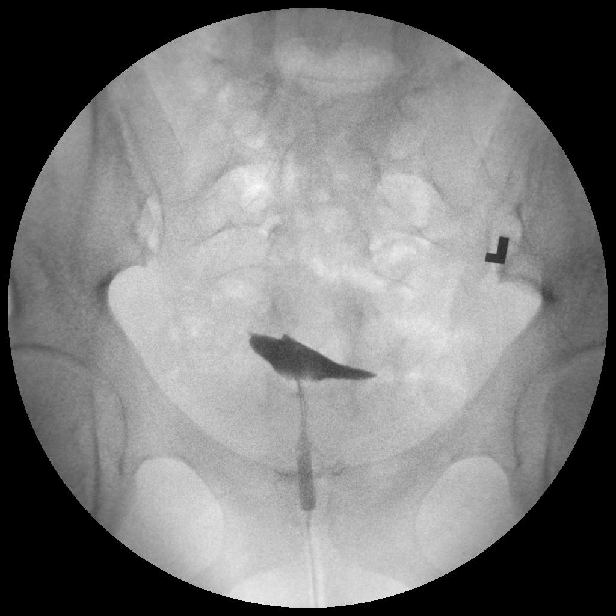

[Series 3: run · 1 of 1 slices shown (3 of 7)]
[im 1/1]
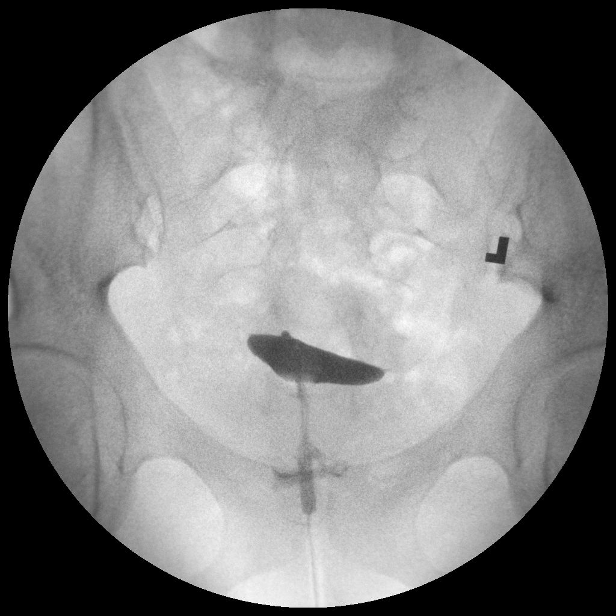

[Series 4: run · 1 of 1 slices shown (4 of 7)]
[im 1/1]
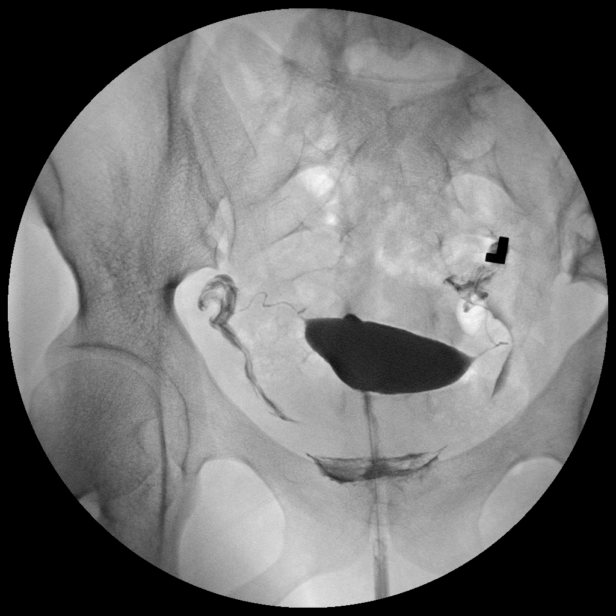

[Series 5: run · 1 of 1 slices shown (5 of 7)]
[im 1/1]
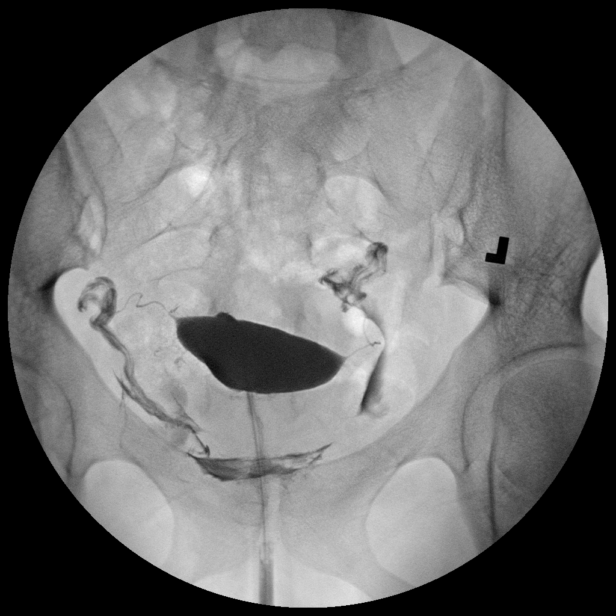

[Series 6: run · 1 of 1 slices shown (6 of 7)]
[im 1/1]
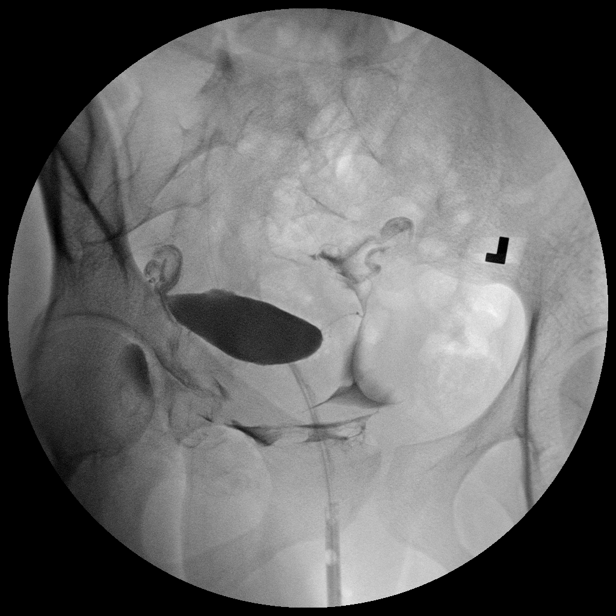

[Series 7: run · 1 of 1 slices shown (7 of 7)]
[im 1/1]
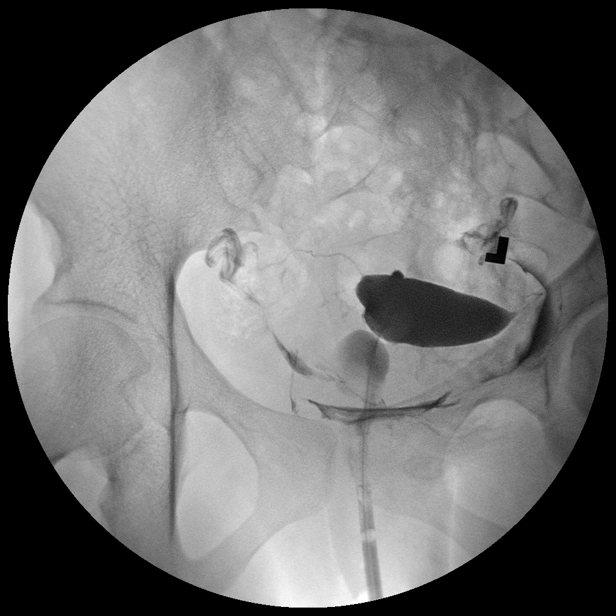

[11 of 11 positions shown; findings below may reference images not displayed]

FLUOROSCOPY TIME:  Radiation Exposure Index (as provided by the
fluoroscopic device):

If the device does not provide the exposure index:

Fluoroscopy Time:  1 minutes 0 seconds

Number of Acquired Images:  4
FINDINGS: The endometrial cavity is normal in appearance and contour. No signs
of mullerian duct anomaly.

Opacification of both fallopian tubes is seen. Both tubes appear
normal. Intraperitoneal spill of contrast from both fallopian tubes
is demonstrated.
IMPRESSION: Normal study. Both fallopian tubes are patent.

## 2023-08-03 ENCOUNTER — Ambulatory Visit
Admission: EM | Admit: 2023-08-03 | Discharge: 2023-08-03 | Disposition: A | Discharge status: Home / Self Care | Payer: Managed Care, Other (non HMO) | Attending: Urgent Care | Admitting: Urgent Care

## 2023-08-03 ENCOUNTER — Encounter: Payer: Self-pay | Admitting: Emergency Medicine

## 2023-08-03 DIAGNOSIS — B3731 Acute candidiasis of vulva and vagina: Secondary | ICD-10-CM

## 2023-08-03 DIAGNOSIS — N898 Other specified noninflammatory disorders of vagina: Secondary | ICD-10-CM

## 2023-08-03 MED ORDER — MICONAZOLE NITRATE 2 % VA CREA: TOPICAL_CREAM | VAGINAL | 0 refills | Status: AC

## 2023-08-03 MED ORDER — VALACYCLOVIR HCL 1 G PO TABS: 1000.0000 mg | ORAL_TABLET | Freq: Two times a day (BID) | ORAL | 0 refills | Status: AC

## 2023-08-03 NOTE — ED Triage Notes (Signed)
Patient c/o vaginal lesions that she noticed in the labia of her vagina today.  There are 2 different ones that are painful and uncomfortable when she puts clothes on and walk.  Patient was seen yesterday and tested for GC/Chlamydia and bladder infection all were negative.

## 2023-08-03 NOTE — ED Provider Notes (Signed)
[facility]    CSN: [id number] Arrival date & time: 08/03/23  1933      History   Chief Complaint Chief Complaint  Patient presents with   Vaginal Lesions    HPI Kathleen Holland is a 32 y.o. female.   32yo female presents today due to concerns of vaginal lesions. Pt had unprotected sex last week on Wednesday. States she started her period on Thursday which carried into Tuesday of this week. Pt noted a small bump on her labia during her menses and thought it to be related, but now that it is over noticed 2 on the R labia, 1 on the L and "white spots" on the clitoris under the hood. Reports they are painful only to palpation, not causing pain otherwise. No severe itching. Went to health department yesterday, was tested for GC, chlam, trich, syphilis and HIV, all tests were negative. Pt was not offered any treatments by the health department.      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 No pertinent surgical history.  OB History     Gravida  3   Para  1   Term  0   Preterm  0   AB  2   Living  1      SAB  0   IAB  0   Ectopic  0   Multiple  0   Live Births  0            Home Medications    Prior to Admission medications   Medication Sig Start Date End Date Taking? Authorizing Provider  miconazole (MICONAZOLE 7) 2 % vaginal cream Apply one application of miconazole to the affected areas of vagina externally once daily x 7 days, 08/03/23  Yes Rae Plotner L, PA  valACYclovir (VALTREX) 1000 MG tablet Take 1 tablet (1,000 mg total) by mouth 2 (two) times daily for 10 days. 08/03/23 08/13/23 Yes Joao Mccurdy L, PA    Family History Family History  Problem Relation Age of Onset   Diabetes Maternal Aunt    Ovarian cancer Maternal Aunt    Hypertension  Maternal Grandmother    Diabetes Maternal Grandmother    Diabetes Maternal Grandfather    Hypertension Mother     Social History Social History   Tobacco Use   Smoking status: Never   Smokeless tobacco: Never  Vaping Use   Vaping status: Never Used  Substance Use Topics   Alcohol use: No   Drug use: Never     Allergies   Patient has no known allergies.   Review of Systems Review of Systems As per HPI  Physical Exam Triage Vital Signs ED Triage Vitals [08/03/23 1951]  Encounter Vitals Group     BP (!) 131/93     Systolic BP Percentile      Diastolic BP Percentile      Pulse Rate 79     Resp 18     Temp 98.9 F (37.2 C)     Temp Source Oral     SpO2 100 %     Weight 166 lb (75.3 kg)     Height 5\' 4"  (1.626 m)     Head Circumference      Peak Flow  Pain Score 0     Pain Loc      Pain Education      Exclude from Growth Chart    No data found.  Updated Vital Signs BP (!) 131/93 (BP Location: Right Arm)   Pulse 79   Temp 98.9 F (37.2 C) (Oral)   Resp 18   Ht 5\' 4"  (1.626 m)   Wt 166 lb (75.3 kg)   LMP 07/26/2023 (Exact Date)   SpO2 100%   Breastfeeding No   BMI 28.49 kg/m   Visual Acuity Right Eye Distance:   Left Eye Distance:   Bilateral Distance:    Right Eye Near:   Left Eye Near:    Bilateral Near:     Physical Exam Vitals and nursing note reviewed.  Constitutional:      General: She is not in acute distress.    Appearance: Normal appearance. She is well-developed and normal weight. She is not ill-appearing or diaphoretic.  HENT:     Head: Normocephalic.  Cardiovascular:     Rate and Rhythm: Normal rate.     Heart sounds: No murmur heard. Pulmonary:     Effort: Pulmonary effort is normal. No respiratory distress.     Breath sounds: No wheezing.  Abdominal:     General: Abdomen is flat. Bowel sounds are normal. There is no distension. There are no signs of injury.     Palpations: Abdomen is soft. There is no shifting  dullness, fluid wave, hepatomegaly, splenomegaly, mass or pulsatile mass.     Tenderness: There is no abdominal tenderness. There is no right CVA tenderness, left CVA tenderness, guarding or rebound. Negative signs include Murphy's sign, Rovsing's sign, McBurney's sign, psoas sign and obturator sign.     Hernia: No hernia is present.  Genitourinary:    General: Normal vulva.     Pubic Area: No rash or pubic lice.      Labia:        Right: Lesion present. No rash, tenderness or injury.        Left: Lesion present. No rash, tenderness or injury.      Urethra: No prolapse, urethral pain, urethral swelling or urethral lesion.     Vagina: Normal. No signs of injury.     Adnexa: Right adnexa normal and left adnexa normal.       Right: No mass, tenderness or fullness.         Left: No mass, tenderness or fullness.       Rectum: Normal.    Lymphadenopathy:     Lower Body: No right inguinal adenopathy. No left inguinal adenopathy.  Neurological:     Mental Status: She is alert.      UC Treatments / Results  Labs (all labs ordered are listed, but only abnormal results are displayed) Labs Reviewed  HSV CULTURE AND TYPING    EKG   Radiology No results found.  Procedures Procedures (including critical care time)  Medications Ordered in UC Medications - No data to display  Initial Impression / Assessment and Plan / UC Course  I have reviewed the triage vital signs and the nursing notes.  Pertinent labs & imaging results that were available during my care of the patient were reviewed by me and considered in my medical decision making (see chart for details).     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 Hsv viral swab obtained. Will start valtrex 1g bid x 7-10 days while awaiting culture  results. Yeast vaginitis - clinical appearance of yeast to clitoris. Testing performed at health dept yesterday neg. Will do trial of topical monistat to  external vulva.   Final Clinical Impressions(s) / UC Diagnoses   Final diagnoses:  Vaginal lesion  Yeast vaginitis     Discharge Instructions      You were tested today for herpes simplex.  Please start taking valtrex one tab twice daily x 10 days. We will call with the results of the swab once received. Please also apply one application of miconazole cream to affected areas of vagina/ vulva once daily x 7 days.   Please avoid all forms of intercourse until symptoms have completely resolved.    ED Prescriptions     Medication Sig Dispense Auth. Provider   valACYclovir (VALTREX) 1000 MG tablet Take 1 tablet (1,000 mg total) by mouth 2 (two) times daily for 10 days. 20 tablet Morgane Joerger L, PA   miconazole (MICONAZOLE 7) 2 % vaginal cream Apply one application of miconazole to the affected areas of vagina externally once daily x 7 days, 45 g Laveda Demedeiros L, PA      PDMP not reviewed this encounter.   Maretta Bees, [place] 08/03/23 2335

## 2023-08-03 NOTE — Discharge Instructions (Addendum)
You were tested today for herpes simplex.  Please start taking valtrex one tab twice daily x 10 days. We will call with the results of the swab once received. Please also apply one application of miconazole cream to affected areas of vagina/ vulva once daily x 7 days.   Please avoid all forms of intercourse until symptoms have completely resolved.

## 2023-08-07 LAB — HSV CULTURE AND TYPING
